# Patient Record
Sex: Female | Born: 1955 | Race: White | Hispanic: No | Marital: Married | Smoking: Never smoker
Health system: Southern US, Community
[De-identification: ages and names within clinical notes are randomized; demographics above are authoritative.]

## PROBLEM LIST (undated history)

## (undated) DIAGNOSIS — F419 Anxiety disorder, unspecified: Secondary | ICD-10-CM

## (undated) DIAGNOSIS — E119 Type 2 diabetes mellitus without complications: Secondary | ICD-10-CM

## (undated) DIAGNOSIS — E785 Hyperlipidemia, unspecified: Secondary | ICD-10-CM

## (undated) DIAGNOSIS — K2270 Barrett's esophagus without dysplasia: Secondary | ICD-10-CM

## (undated) DIAGNOSIS — R011 Cardiac murmur, unspecified: Secondary | ICD-10-CM

## (undated) DIAGNOSIS — T7840XA Allergy, unspecified, initial encounter: Secondary | ICD-10-CM

## (undated) DIAGNOSIS — I639 Cerebral infarction, unspecified: Secondary | ICD-10-CM

## (undated) DIAGNOSIS — Z780 Asymptomatic menopausal state: Secondary | ICD-10-CM

## (undated) DIAGNOSIS — G4700 Insomnia, unspecified: Secondary | ICD-10-CM

## (undated) DIAGNOSIS — I10 Essential (primary) hypertension: Secondary | ICD-10-CM

## (undated) DIAGNOSIS — R748 Abnormal levels of other serum enzymes: Secondary | ICD-10-CM

## (undated) HISTORY — DX: Essential (primary) hypertension: I10

## (undated) HISTORY — DX: Cardiac murmur, unspecified: R01.1

## (undated) HISTORY — DX: Anxiety disorder, unspecified: F41.9

## (undated) HISTORY — DX: Insomnia, unspecified: G47.00

## (undated) HISTORY — DX: Cerebral infarction, unspecified: I63.9

## (undated) HISTORY — DX: Hyperlipidemia, unspecified: E78.5

## (undated) HISTORY — PX: TONSILLECTOMY: SUR1361

## (undated) HISTORY — DX: Asymptomatic menopausal state: Z78.0

## (undated) HISTORY — DX: Barrett's esophagus without dysplasia: K22.70

## (undated) HISTORY — DX: Allergy, unspecified, initial encounter: T78.40XA

## (undated) HISTORY — DX: Type 2 diabetes mellitus without complications: E11.9

## (undated) HISTORY — DX: Abnormal levels of other serum enzymes: R74.8

---

## 1990-02-25 HISTORY — PX: CHOLECYSTECTOMY: SHX55

## 1999-02-26 LAB — HM COLONOSCOPY: HM Colonoscopy: NORMAL

## 2007-04-29 ENCOUNTER — Other Ambulatory Visit: Payer: Self-pay

## 2007-04-29 ENCOUNTER — Inpatient Hospital Stay: Payer: Self-pay | Admitting: Internal Medicine

## 2007-09-23 ENCOUNTER — Ambulatory Visit: Payer: Self-pay | Admitting: Internal Medicine

## 2007-09-30 ENCOUNTER — Ambulatory Visit: Payer: Self-pay | Admitting: Internal Medicine

## 2007-10-15 ENCOUNTER — Ambulatory Visit: Payer: Self-pay | Admitting: Internal Medicine

## 2008-08-12 ENCOUNTER — Emergency Department: Payer: Self-pay | Admitting: Emergency Medicine

## 2009-07-10 ENCOUNTER — Other Ambulatory Visit: Payer: Self-pay | Admitting: Internal Medicine

## 2009-08-09 ENCOUNTER — Ambulatory Visit: Payer: Self-pay | Admitting: Internal Medicine

## 2009-11-25 ENCOUNTER — Emergency Department: Payer: Self-pay | Admitting: Emergency Medicine

## 2009-11-29 ENCOUNTER — Encounter: Payer: Self-pay | Admitting: General Practice

## 2010-11-21 ENCOUNTER — Telehealth: Payer: Self-pay | Admitting: Internal Medicine

## 2010-11-21 NOTE — Telephone Encounter (Signed)
Patient needs a refill on Nexium per [facility] patient need to contact Dr. Darrick Huntsman here. Contact patient on her cell phone [phone].

## 2010-11-22 ENCOUNTER — Other Ambulatory Visit: Payer: Self-pay | Admitting: Internal Medicine

## 2010-11-23 ENCOUNTER — Other Ambulatory Visit: Payer: Self-pay | Admitting: Internal Medicine

## 2010-11-23 MED ORDER — ESOMEPRAZOLE MAGNESIUM 40 MG PO CPDR: 40.0000 mg | DELAYED_RELEASE_CAPSULE | Freq: Every day | ORAL | Status: DC

## 2010-11-23 NOTE — Telephone Encounter (Signed)
Left message for patient to return my call.

## 2010-11-26 MED ORDER — ESOMEPRAZOLE MAGNESIUM 40 MG PO CPDR: 40.0000 mg | DELAYED_RELEASE_CAPSULE | Freq: Every day | ORAL | Status: DC

## 2011-01-24 ENCOUNTER — Encounter: Payer: Self-pay | Admitting: Internal Medicine

## 2011-01-24 ENCOUNTER — Other Ambulatory Visit (HOSPITAL_COMMUNITY)
Admission: RE | Admit: 2011-01-24 | Discharge: 2011-01-24 | Disposition: A | Discharge status: Home / Self Care | Payer: PRIVATE HEALTH INSURANCE | Source: Ambulatory Visit | Attending: Internal Medicine | Admitting: Internal Medicine

## 2011-01-24 ENCOUNTER — Ambulatory Visit (INDEPENDENT_AMBULATORY_CARE_PROVIDER_SITE_OTHER): Payer: PRIVATE HEALTH INSURANCE | Admitting: Internal Medicine

## 2011-01-24 DIAGNOSIS — Z01419 Encounter for gynecological examination (general) (routine) without abnormal findings: Secondary | ICD-10-CM | POA: Insufficient documentation

## 2011-01-24 DIAGNOSIS — F411 Generalized anxiety disorder: Secondary | ICD-10-CM

## 2011-01-24 DIAGNOSIS — E669 Obesity, unspecified: Secondary | ICD-10-CM

## 2011-01-24 DIAGNOSIS — Z780 Asymptomatic menopausal state: Secondary | ICD-10-CM

## 2011-01-24 DIAGNOSIS — K760 Fatty (change of) liver, not elsewhere classified: Secondary | ICD-10-CM | POA: Insufficient documentation

## 2011-01-24 DIAGNOSIS — E119 Type 2 diabetes mellitus without complications: Secondary | ICD-10-CM

## 2011-01-24 DIAGNOSIS — R748 Abnormal levels of other serum enzymes: Secondary | ICD-10-CM

## 2011-01-24 DIAGNOSIS — F419 Anxiety disorder, unspecified: Secondary | ICD-10-CM

## 2011-01-24 DIAGNOSIS — Z1159 Encounter for screening for other viral diseases: Secondary | ICD-10-CM | POA: Insufficient documentation

## 2011-01-24 DIAGNOSIS — Z124 Encounter for screening for malignant neoplasm of cervix: Secondary | ICD-10-CM

## 2011-01-24 DIAGNOSIS — N951 Menopausal and female climacteric states: Secondary | ICD-10-CM

## 2011-01-24 DIAGNOSIS — K2270 Barrett's esophagus without dysplasia: Secondary | ICD-10-CM | POA: Insufficient documentation

## 2011-01-24 MED ORDER — ESOMEPRAZOLE MAGNESIUM 40 MG PO CPDR: 40.0000 mg | DELAYED_RELEASE_CAPSULE | Freq: Every day | ORAL | Status: DC

## 2011-01-24 MED ORDER — ESCITALOPRAM OXALATE 5 MG PO TABS: 5.0000 mg | ORAL_TABLET | Freq: Every day | ORAL | Status: AC

## 2011-01-24 NOTE — Progress Notes (Signed)
  Subjective:    Patient ID: Autumn Bird, female    DOB: 10-02-1955, 55 y.o.   MRN: [id number]  HPI   55 yo white female,  Presents for annual exam.  Recent employee labs indicate new onset dm with hgba1c of 6.7  She has also gained weight despite reporting attempts to diet and exercise.  Frequently skips meals due to work schedule. Cites stressors and work as obstacles to healthy eating habits.  Past Medical History  Diagnosis Date  . Barrett's esophagus     managed by Dr. Mechele Collin with Nexium  . Elevated liver enzymes     chronic, no workup done  . Menopause age 25   No current outpatient prescriptions on file prior to visit.    Review of Systems  Constitutional: Positive for unexpected weight change. Negative for fever and chills.  HENT: Negative for hearing loss, ear pain, nosebleeds, congestion, sore throat, facial swelling, rhinorrhea, sneezing, mouth sores, trouble swallowing, neck pain, neck stiffness, voice change, postnasal drip, sinus pressure, tinnitus and ear discharge.   Eyes: Negative for pain, discharge, redness and visual disturbance.  Respiratory: Negative for cough, chest tightness, shortness of breath, wheezing and stridor.   Cardiovascular: Negative for chest pain, palpitations and leg swelling.  Musculoskeletal: Negative for myalgias and arthralgias.  Skin: Negative for color change and rash.  Neurological: Negative for dizziness, weakness, light-headedness and headaches.  Hematological: Negative for adenopathy.       Objective:   Physical Exam  Constitutional: She is oriented to person, place, and time. She appears well-developed and well-nourished.  HENT:  Mouth/Throat: Oropharynx is clear and moist.  Eyes: EOM are normal. Pupils are equal, round, and reactive to light. No scleral icterus.  Neck: Normal range of motion. Neck supple. No JVD present. No thyromegaly present.  Cardiovascular: Normal rate, regular rhythm, normal heart sounds and intact distal  pulses.   Pulmonary/Chest: Effort normal and breath sounds normal.  Abdominal: Soft. Bowel sounds are normal. She exhibits no mass. There is no tenderness.  Genitourinary: Vagina normal and uterus normal.  Musculoskeletal: Normal range of motion. She exhibits no edema.  Lymphadenopathy:    She has no cervical adenopathy.  Neurological: She is alert and oriented to person, place, and time.  Skin: Skin is warm and dry.  Psychiatric: She has a normal mood and affect.          Assessment & Plan:

## 2011-01-24 NOTE — Patient Instructions (Addendum)
Consider  the Medifast diet to help you lose weight.  It is very, very effective and takes all of the calculations and guesswork for you.   Call Dr. Dahlia Client office to see if he is doing another seminar on the [employer].     Your goal with exercise should be 20 minutes of aerobic exercise 5 days per week  I recommend a trial of low dose Lexapro (escitalopram), to help your mood and your hot flashes:  5 mg daily.

## 2011-01-25 ENCOUNTER — Ambulatory Visit: Payer: Self-pay | Admitting: Internal Medicine

## 2011-01-26 ENCOUNTER — Encounter: Payer: Self-pay | Admitting: Internal Medicine

## 2011-01-26 DIAGNOSIS — E669 Obesity, unspecified: Secondary | ICD-10-CM | POA: Insufficient documentation

## 2011-01-26 DIAGNOSIS — Z780 Asymptomatic menopausal state: Secondary | ICD-10-CM | POA: Insufficient documentation

## 2011-01-26 DIAGNOSIS — E1169 Type 2 diabetes mellitus with other specified complication: Secondary | ICD-10-CM | POA: Insufficient documentation

## 2011-01-26 NOTE — Assessment & Plan Note (Signed)
New diagnosisl suggested by hgba1c of 6.7 Diet and exercise counseling given with reommendaations to consider the Medifast diet.

## 2011-01-26 NOTE — Assessment & Plan Note (Signed)
She reports weight gain, hot flashes, depression, and fatigue.  Discussed therapy choices but she did not want to resume menses or gain more weight so no treatment accepted .

## 2011-01-26 NOTE — Assessment & Plan Note (Signed)
Discussed the probability that given her central obesity, new onset diabetes, hypertriglyceridemia, her elevated liver enzymes may be due to fatty liver, which can lead to cirrhosis.  She is not interested in ruling out this diagnosis because her previous PCP Dr. Randa Lynn thought the the etiology was her PPI use, which cannot be suspended because she has a history of Barrett's esophagus. Recommended abdominal ultrasound and G referral but she is deferring for now.

## 2011-01-26 NOTE — Assessment & Plan Note (Addendum)
By prior EGD.  Continue PPI and followup with GI

## 2011-01-30 ENCOUNTER — Encounter: Payer: Self-pay | Admitting: Internal Medicine

## 2011-02-13 ENCOUNTER — Encounter: Payer: Self-pay | Admitting: Internal Medicine

## 2012-02-14 ENCOUNTER — Other Ambulatory Visit: Payer: Self-pay | Admitting: Internal Medicine

## 2012-02-14 LAB — COMPREHENSIVE METABOLIC PANEL
Alkaline Phosphatase: 105 U/L (ref 50–136)
Bilirubin,Total: 0.7 mg/dL (ref 0.2–1.0)
Calcium, Total: 8.8 mg/dL (ref 8.5–10.1)
Chloride: 105 mmol/L (ref 98–107)
Co2: 28 mmol/L (ref 21–32)
EGFR (African American): >60
EGFR (Non-African Amer.): >60
Potassium: 3.8 mmol/L (ref 3.5–5.1)
SGOT(AST): 196 U/L — ABNORMAL HIGH (ref 15–37)
SGPT (ALT): 191 U/L — ABNORMAL HIGH (ref 12–78)

## 2012-02-14 LAB — CBC WITH DIFFERENTIAL/PLATELET
Basophil %: 1.1 %
HCT: 42.2 % (ref 35.0–47.0)
HGB: 14.8 g/dL (ref 12.0–16.0)
Lymphocyte %: 49.1 %
Monocyte %: 7.5 %
Neutrophil #: 3.1 10*3/uL (ref 1.4–6.5)
Neutrophil %: 39.5 %
RDW: 13 % (ref 11.5–14.5)
WBC: 7.8 10*3/uL (ref 3.6–11.0)

## 2012-02-14 LAB — BILIRUBIN, DIRECT: Bilirubin, Direct: 0.3 mg/dL — ABNORMAL HIGH (ref 0.00–0.20)

## 2012-02-14 LAB — LIPID PANEL
Cholesterol: 209 mg/dL — ABNORMAL HIGH (ref 0–200)
HDL Cholesterol: 37 mg/dL — ABNORMAL LOW (ref 40–60)
Ldl Cholesterol, Calc: 142 mg/dL — ABNORMAL HIGH (ref 0–100)
Triglycerides: 151 mg/dL (ref 0–200)

## 2012-05-08 ENCOUNTER — Ambulatory Visit: Payer: Self-pay | Admitting: General Practice

## 2012-05-26 ENCOUNTER — Ambulatory Visit: Payer: Self-pay | Admitting: General Practice

## 2012-06-25 ENCOUNTER — Ambulatory Visit: Payer: Self-pay | Admitting: General Practice

## 2013-09-19 ENCOUNTER — Emergency Department: Payer: Self-pay | Admitting: Emergency Medicine

## 2014-03-22 ENCOUNTER — Ambulatory Visit: Payer: Self-pay | Admitting: Internal Medicine

## 2014-03-22 LAB — HM MAMMOGRAPHY: HM MAMMO: NEGATIVE

## 2014-06-01 ENCOUNTER — Encounter: Payer: Self-pay | Admitting: *Deleted

## 2014-06-08 ENCOUNTER — Ambulatory Visit: Payer: Self-pay

## 2014-07-05 ENCOUNTER — Other Ambulatory Visit: Payer: Self-pay

## 2014-07-05 NOTE — Patient Instructions (Signed)
Basic Carbohydrate Counting for Diabetes Mellitus Carbohydrate counting is a method for keeping track of the amount of carbohydrates you eat. Eating carbohydrates naturally increases the level of sugar (glucose) in your blood, so it is important for you to know the amount that is okay for you to have in every meal. Carbohydrate counting helps keep the level of glucose in your blood within normal limits. The amount of carbohydrates allowed is different for every person. A dietitian can help you calculate the amount that is right for you. Once you know the amount of carbohydrates you can have, you can count the carbohydrates in the foods you want to eat. Carbohydrates are found in the following foods:  Grains, such as breads and cereals.  Dried beans and soy products.  Starchy vegetables, such as potatoes, peas, and corn.  Fruit and fruit juices.  Milk and yogurt.  Sweets and snack foods, such as cake, cookies, candy, chips, soft drinks, and fruit drinks. CARBOHYDRATE COUNTING There are two ways to count the carbohydrates in your food. You can use either of the methods or a combination of both. Reading the "Nutrition Facts" on Packaged Food The "Nutrition Facts" is an area that is included on the labels of almost all packaged food and beverages in the [place]. It includes the serving size of that food or beverage and information about the nutrients in each serving of the food, including the grams (g) of carbohydrate per serving.  Decide the number of servings of this food or beverage that you will be able to eat or drink. Multiply that number of servings by the number of grams of carbohydrate that is listed on the label for that serving. The total will be the amount of carbohydrates you will be having when you eat or drink this food or beverage. Learning Standard Serving Sizes of Food When you eat food that is not packaged or does not include "Nutrition Facts" on the label, you need to  measure the servings in order to count the amount of carbohydrates.A serving of most carbohydrate-rich foods contains about 15 g of carbohydrates. The following list includes serving sizes of carbohydrate-rich foods that provide 15 g ofcarbohydrate per serving:   1 slice of bread (1 oz) or 1 six-inch tortilla.    of a hamburger bun or English muffin.  4-6 crackers.   cup unsweetened dry cereal.    cup hot cereal.   cup rice or pasta.    cup mashed potatoes or  of a large baked potato.  1 cup fresh fruit or one small piece of fruit.    cup canned or frozen fruit or fruit juice.  1 cup milk.   cup plain fat-free yogurt or yogurt sweetened with artificial sweeteners.   cup cooked dried beans or starchy vegetable, such as peas, corn, or potatoes.  Decide the number of standard-size servings that you will eat. Multiply that number of servings by 15 (the grams of carbohydrates in that serving). For example, if you eat 2 cups of strawberries, you will have eaten 2 servings and 30 g of carbohydrates (2 servings x 15 g = 30 g). For foods such as soups and casseroles, in which more than one food is mixed in, you will need to count the carbohydrates in each food that is included. EXAMPLE OF CARBOHYDRATE COUNTING Sample Dinner  3 oz chicken breast.   cup of brown rice.   cup of corn.  1 cup milk.   1 cup strawberries with   sugar-free whipped topping.  Carbohydrate Calculation Step 1: Identify the foods that contain carbohydrates:   Rice.   Corn.   Milk.   Strawberries. Step 2:Calculate the number of servings eaten of each:   2 servings of rice.   1 serving of corn.   1 serving of milk.   1 serving of strawberries. Step 3: Multiply each of those number of servings by 15 g:   2 servings of rice x 15 g = 30 g.   1 serving of corn x 15 g = 15 g.   1 serving of milk x 15 g = 15 g.   1 serving of strawberries x 15 g = 15 g. Step 4: Add  together all of the amounts to find the total grams of carbohydrates eaten: 30 g + 15 g + 15 g + 15 g = 75 g. Document Released: 02/11/2005 Document Revised: 06/28/2013 Document Reviewed: 01/08/2013 ExitCare Patient Information 2015 [employer]. This information is not intended to replace advice given to you by your health care provider. Make sure you discuss any questions you have with your health care provider. Diabetes Mellitus and Food It is important for you to manage your blood sugar (glucose) level. Your blood glucose level can be greatly affected by what you eat. Eating healthier foods in the appropriate amounts throughout the day at about the same time each day will help you control your blood glucose level. It can also help slow or prevent worsening of your diabetes mellitus. Healthy eating may even help you improve the level of your blood pressure and reach or maintain a healthy weight.  HOW CAN FOOD AFFECT ME? Carbohydrates Carbohydrates affect your blood glucose level more than any other type of food. Your dietitian will help you determine how many carbohydrates to eat at each meal and teach you how to count carbohydrates. Counting carbohydrates is important to keep your blood glucose at a healthy level, especially if you are using insulin or taking certain medicines for diabetes mellitus. Alcohol Alcohol can cause sudden decreases in blood glucose (hypoglycemia), especially if you use insulin or take certain medicines for diabetes mellitus. Hypoglycemia can be a life-threatening condition. Symptoms of hypoglycemia (sleepiness, dizziness, and disorientation) are similar to symptoms of having too much alcohol.  If your health care provider has given you approval to drink alcohol, do so in moderation and use the following guidelines:  Women should not have more than one drink per day, and men should not have more than two drinks per day. One drink is equal to:  12 oz of beer.  5 oz of  wine.  1 oz of hard liquor.  Do not drink on an empty stomach.  Keep yourself hydrated. Have water, diet soda, or unsweetened iced tea.  Regular soda, juice, and other mixers might contain a lot of carbohydrates and should be counted. WHAT FOODS ARE NOT RECOMMENDED? As you make food choices, it is important to remember that all foods are not the same. Some foods have fewer nutrients per serving than other foods, even though they might have the same number of calories or carbohydrates. It is difficult to get your body what it needs when you eat foods with fewer nutrients. Examples of foods that you should avoid that are high in calories and carbohydrates but low in nutrients include:  Trans fats (most processed foods list trans fats on the Nutrition Facts label).  Regular soda.  Juice.  Candy.  Sweets, such as cake, pie,   doughnuts, and cookies.  Fried foods. WHAT FOODS CAN I EAT? Have nutrient-rich foods, which will nourish your body and keep you healthy. The food you should eat also will depend on several factors, including:  The calories you need.  The medicines you take.  Your weight.  Your blood glucose level.  Your blood pressure level.  Your cholesterol level. You also should eat a variety of foods, including:  Protein, such as meat, poultry, fish, tofu, nuts, and seeds (lean animal proteins are best).  Fruits.  Vegetables.  Dairy products, such as milk, cheese, and yogurt (low fat is best).  Breads, grains, pasta, cereal, rice, and beans.  Fats such as olive oil, trans fat-free margarine, canola oil, avocado, and olives. DOES EVERYONE WITH DIABETES MELLITUS HAVE THE SAME MEAL PLAN? Because every person with diabetes mellitus is different, there is not one meal plan that works for everyone. It is very important that you meet with a dietitian who will help you create a meal plan that is just right for you. Document Released: 11/08/2004 Document Revised:  02/16/2013 Document Reviewed: 01/08/2013 ExitCare Patient Information 2015 [employer]. This information is not intended to replace advice given to you by your health care provider. Make sure you discuss any questions you have with your health care provider.  

## 2014-07-05 NOTE — Addendum Note (Signed)
Addended by: Pollyann Glen on: 07/05/2014 03:45 PM   Modules accepted: Orders, Medications

## 2014-07-05 NOTE — Patient Outreach (Addendum)
[place] [facility]) Care Management  [place]  07/05/2014   SHARREN SCHNURR [birth year] [id number]  Subjective: Patient comes to me with a complaint of weight gain and elevated am blood sugars.  She complains that she's been over eating at work because the staff is getting donuts and pizza for the last few weeks- she loves pasta and sweets and has difficulty limiting these foods at work. Patient states she's exercising 2x/week.   Objective: Blood sugars within ADA guidelines- weight up approx. 4 lbs since I saw her in October, 2015.   Current Medications:  Current Outpatient Prescriptions  Medication Sig Dispense Refill  . b complex vitamins tablet Take 1 tablet by mouth daily.      Marland Kitchen escitalopram (LEXAPRO) 5 MG tablet TAKE 1 TABLET BY MOUTH TWICE DAILY    . glucose blood test strip USE 1 STRIP TWICE A DAY AS DIRECTED    . metFORMIN (GLUCOPHAGE-XR) 500 MG 24 hr tablet TAKE 1 TABLET BY MOUTH TWICE A DAY    . omeprazole (PRILOSEC) 40 MG capsule Take 40 mg by mouth daily.     [name] DELICA LANCETS 44W MISC USE 1 LANCET TO SKIN TWICE A DAY AS DIRECTED    . vitamin B-12 (CYANOCOBALAMIN) 100 MCG tablet Take by mouth daily.      No current facility-administered medications for this visit.    Functional Status:  In your present state of health, do you have any difficulty performing the following activities: 07/05/2014  Hearing? N  Vision? N  Difficulty concentrating or making decisions? N  Walking or climbing stairs? N  Dressing or bathing? N  Doing errands, shopping? N    Fall/Depression Screening: PHQ 2/9 Scores 07/05/2014  PHQ - 2 Score 0    [facility] plate method of eating handout given to the patient- reviewed carbs and the need to limit carbs to manage weight and blood sugar. Begin by limiting carbs to 4/meal (3 ideal) control.  Discussed the need to increase exercise but not willing to set this as a goal at this time.   [facility] CM Care Plan Problem One    Patient Outreach from 07/05/2014 in [place] Problem One  Patient states she's overweight and blood sugars have been elevated in the am recently, especially when she's at work   Care Plan for Problem One  Active   [facility]N Long Term Goal (31-90 days)  Patient will decrease weight to 175 lbs within 6 months   THN Long Term Goal Start Date  07/05/14   Interventions for Problem One Long Term Goal  - fill 1/2 your plate with "free" vegetables for lunch and supper   - limit carbs to 4 servings per meal, 1 carb for a snack   - try wine with supper     - limit food at work to one "extra" only    - follow the "plate method" of eating     Plan: Follow care plan.  Patient ideally should eat 3 meals and 2 snacks (if needed); skipping breakfast can lead to overeating at the next meal.  Follow up with MD in December 2016.  To call me at any time to additional education/supper.   Gentry Fitz, RN, BA, [place], [place] Direct Dial:  [phone]  Fax:  [phone] E-mail: [email or web address] [address] [place], [place]  [address]

## 2014-09-19 ENCOUNTER — Telehealth: Payer: Self-pay

## 2014-10-25 ENCOUNTER — Telehealth: Payer: Self-pay

## 2014-12-02 ENCOUNTER — Telehealth: Payer: Self-pay

## 2014-12-12 ENCOUNTER — Other Ambulatory Visit: Payer: Self-pay

## 2014-12-12 NOTE — Patient Outreach (Signed)
[place] [facility]) Care Management  12/12/2014  Autumn Bird [birth year] [id number]   Spoke to patient by phone to discuss transfer of care to the pharmacy team.  Encouraged the patient to call me if she has questions about diabetes but confirmed her care will be ongoing with the pharmacy team.    Gentry Fitz, RN, [place], [place], [place]:  [phone]  Fax:  [phone] E-mail: [email or web address] [address] [place], [place]  [address]

## 2014-12-28 ENCOUNTER — Other Ambulatory Visit: Payer: Self-pay | Admitting: Pharmacist

## 2014-12-30 ENCOUNTER — Encounter: Payer: Self-pay | Admitting: Pharmacist

## 2014-12-30 NOTE — Patient Outreach (Signed)
[place] [facility]) Care Management  [employer]   12/30/2014  Autumn Bird 04-16-1955 [id number]  Subjective: Autumn Bird is 59 year old female who presents for her [facility] Diabetes visit with the pharmacist. She has not been eating appropriately at work; she admits to skipping meals and "grabbing whatever is available" when her blood sugar drops. She continues to be concerned about her weight and would like to maintain her current weight over the next few months during the holidays. She has stopped taking the B complex and B-12 vitamins due to experiencing low blood sugar when taking these vitamins. She was prescribed a statin for her cholesterol, but admits to not taking this medication. Her blood pressure was elevated at this visit, however, patient states it is usually within goal. She is not interested in taking an ACE inhibitor or an ARB for management of her blood pressure at this time. Patient was not sure if she should be taking a daily aspirin. She received her influenza vaccination 12/17/14.   Objective:  Fasting blood sugar reading was 120 mg/dl.  A1c was 6.3% on 02/24/14. Blood pressure reading was 148/72 mmHg at the visit.  Current Medications: Current Outpatient Prescriptions  Medication Sig Dispense Refill  . escitalopram (LEXAPRO) 5 MG tablet TAKE 1 TABLET BY MOUTH TWICE DAILY    . glucose blood test strip USE 1 STRIP TWICE A DAY AS DIRECTED    . metFORMIN (GLUCOPHAGE-XR) 500 MG 24 hr tablet TAKE 1 TABLET BY MOUTH TWICE A DAY    . omeprazole (PRILOSEC) 40 MG capsule Take 40 mg by mouth 2 (two) times daily.     Autumn Bird DELICA LANCETS 89Q MISC USE 1 LANCET TO SKIN TWICE A DAY AS DIRECTED    . b complex vitamins tablet Take 1 tablet by mouth daily.      . vitamin B-12 (CYANOCOBALAMIN) 100 MCG tablet Take by mouth daily.      No current facility-administered medications for this visit.    Functional Status: In your present state of health, do you  have any difficulty performing the following activities: 12/30/2014 07/05/2014  Hearing? N N  Vision? N N  Difficulty concentrating or making decisions? N N  Walking or climbing stairs? N N  Dressing or bathing? N N  Doing errands, shopping? N N    Fall/Depression Screening: PHQ 2/9 Scores 07/05/2014  PHQ - 2 Score 0   [facility] CM Care Plan Problem One        Most Recent Value   Care Plan Problem One  Patient states she's overweight    Role Documenting the Problem One  Clinical Pharmacist   Care Plan for Problem One  Active   THN Long Term Goal (31-90 days)  Patient will maintain weight to 175 lbs within 6 months,  evidenced by patient report at next visit   [place] Start Date  12/28/14   Interventions for Problem One Long Term Goal  - fill 1/2 your plate with "free" vegetables for lunch and supper   - limit carbs to 4 servings per meal, 1 carb for a snack   - try wine with supper     - limit food at work to one "extra" only    - follow the "plate method" of eating      Assessment: 1. Diabetes: A1c at goal of less than 7% in December 2015. Patient is interested in obtaining an A1c at the [facility] before her  physical in January. Last A1c was in December of 2015, although at goal, patient would like to maintain A1c less than 6% as in the past. Not currently taking an aspirin or statin. Patient is over 89, diabetic, hypercholesterolemia and possibly carriers a diagnosis of hypertension in the past. 2. Blood Pressure: above goal of 140/90 mmHg at today's visit. Patient states blood pressure is usually within goal. Not currently on any antihypertensive medications. Patient is not interested in taking an ACE inhibitor or an ARB as part of her diabetes regimen. 3. Depression assessment completed 06/2014; next assessment due in May 2017. 4. Nutrition assessment completed today; next assessment due January 2017.  5. Quality of life assessment completed today; next assessment due March  2017.   Plan: 1. Diabetes: I will contact the nurse at the [facility] to schedule the A1c for Autumn Bird. Patient to follow prescribed meal plan (plate method). I will resend the two EMMI education programs on diabetes and carbohydrates for her to review. Will recommend restarting exercise regimen at next pharmacist visit once patient is finished with new house. 2. Blood Pressure: will follow up with patient after her physical in January to see if blood pressure is within goal. May need to consider adding an ACE inhibitor or ARB if no contraindications. 3. Scheduled to see her primary care physician for a physical 02/2015. Patient was encouraged to discuss the addition of aspirin to her regimen. 4. Patient will follow up with the pharmacist in 6 months.    Abdias Hickam K. Dicky Doe, PharmD [place] [facility] [phone]

## 2015-01-11 ENCOUNTER — Other Ambulatory Visit: Payer: Self-pay

## 2015-01-11 NOTE — Patient Outreach (Signed)
[name] Care Management  01/11/2015  Autumn Bird 07-Jan-1956 [id number]  I received a message from [name] that [name] would like a POCT A1C.  I have called [name], left a message and asked her to call me to schedule an appointment.    Gentry Fitz, RN, BA, [place], [place] Direct Dial:  [phone]  Fax:  [phone] E-mail: [email or web address] [address], [place], [place]  [address]

## 2015-03-17 ENCOUNTER — Encounter: Payer: Self-pay | Admitting: Pharmacist

## 2015-06-28 ENCOUNTER — Ambulatory Visit: Payer: Self-pay | Admitting: Pharmacist

## 2016-01-08 ENCOUNTER — Encounter: Payer: Self-pay | Admitting: Physician Assistant

## 2016-01-08 ENCOUNTER — Ambulatory Visit: Payer: Self-pay | Admitting: Physician Assistant

## 2016-01-08 VITALS — BP 130/70 | HR 91 | Temp 98.7°F

## 2016-01-08 DIAGNOSIS — J0100 Acute maxillary sinusitis, unspecified: Secondary | ICD-10-CM

## 2016-01-08 MED ORDER — AZITHROMYCIN 250 MG PO TABS: ORAL_TABLET | ORAL | 0 refills | Status: DC

## 2016-01-08 MED ORDER — FLUTICASONE PROPIONATE 50 MCG/ACT NA SUSP: 2.0000 | Freq: Every day | NASAL | 6 refills | Status: DC

## 2016-01-08 NOTE — Progress Notes (Signed)
S: C/o runny nose and congestion for 3 days, no fever, chills, cp/sob, v/d; mucus is green and thick, cough is sporadic, c/o of facial and dental pain. Had same sx 2 weeks ago and got better, now sx returned and are worse  Using otc meds:   O: PE: vitals wnl, nad, perrl eomi, normocephalic, tms dull, nasal mucosa red and swollen, throat injected, neck supple no lymph, lungs c t a, cv rrr, neuro intact  A:  Acute sinusitis   P: drink fluids, continue regular meds , use otc meds of choice, return if not improving in 5 days, return earlier if worsening , zpack, flonase ]

## 2017-04-07 ENCOUNTER — Other Ambulatory Visit: Payer: Self-pay | Admitting: Internal Medicine

## 2017-04-07 DIAGNOSIS — R945 Abnormal results of liver function studies: Secondary | ICD-10-CM

## 2017-04-15 ENCOUNTER — Ambulatory Visit
Admission: RE | Admit: 2017-04-15 | Discharge: 2017-04-15 | Disposition: A | Discharge status: Home / Self Care | Payer: BLUE CROSS/BLUE SHIELD | Source: Ambulatory Visit | Attending: Internal Medicine | Admitting: Internal Medicine

## 2017-04-15 DIAGNOSIS — K760 Fatty (change of) liver, not elsewhere classified: Secondary | ICD-10-CM | POA: Insufficient documentation

## 2017-04-15 DIAGNOSIS — R945 Abnormal results of liver function studies: Secondary | ICD-10-CM | POA: Diagnosis not present

## 2019-06-23 ENCOUNTER — Ambulatory Visit: Payer: BC Managed Care – PPO | Admitting: Dermatology

## 2019-06-23 ENCOUNTER — Other Ambulatory Visit: Payer: Self-pay

## 2019-06-23 DIAGNOSIS — L821 Other seborrheic keratosis: Secondary | ICD-10-CM | POA: Diagnosis not present

## 2019-06-23 DIAGNOSIS — L719 Rosacea, unspecified: Secondary | ICD-10-CM | POA: Diagnosis not present

## 2019-06-23 DIAGNOSIS — L570 Actinic keratosis: Secondary | ICD-10-CM | POA: Diagnosis not present

## 2019-06-23 DIAGNOSIS — L578 Other skin changes due to chronic exposure to nonionizing radiation: Secondary | ICD-10-CM

## 2019-06-23 NOTE — Progress Notes (Signed)
   Follow-Up Visit   Subjective  Autumn Bird is a 64 y.o. female who presents for the following: Other (Spots of face get irritated. They have been there for over a year an d she picks at them.).   `  The following portions of the chart were reviewed this encounter and updated as appropriate:  Tobacco  Allergies  Meds  Problems  Med Hx  Surg Hx  Fam Hx      Review of Systems:  No other skin or systemic complaints except as noted in HPI or Assessment and Plan.  Objective  Well appearing patient in no apparent distress; mood and affect are within normal limits.  A focused examination was performed including face. Relevant physical exam findings are noted in the Assessment and Plan.  Objective  Left Upper Vermilion Lip, Right Medial Cheek (3), Right nose: Erythematous thin papules/macules with gritty scale.   Objective  Head - Anterior (Face): Mid face erythema with telangiectasias.   Assessment & Plan    Actinic Damage - diffuse scaly erythematous macules with underlying dyspigmentation - Recommend daily broad spectrum sunscreen SPF 30+ to sun-exposed areas, reapply every 2 hours as needed.  - Call for new or changing lesions.  Seborrheic Keratoses - Stuck-on, waxy, tan-brown papules and plaques  - Discussed benign etiology and prognosis. - Observe - Call for any changes  AK (actinic keratosis) (5) Right nose; Left Upper Vermilion Lip; Right Medial Cheek (3)  Destruction of lesion - Left Upper Vermilion Lip, Right Medial Cheek, Right nose Complexity: simple   Destruction method: cryotherapy   Informed consent: discussed and consent obtained   Timeout:  patient name, date of birth, surgical site, and procedure verified Lesion destroyed using liquid nitrogen: Yes   Region frozen until ice ball extended beyond lesion: Yes   Outcome: patient tolerated procedure well with no complications   Post-procedure details: wound care instructions given    Rosacea Head -  Anterior (Face)  Mild - no treatment  Return in about 3 months (around 09/22/2019).  I, Ashok Cordia, CMA, am acting as scribe for Sarina Ser, MD .    Documentation: I have reviewed the above documentation for accuracy and completeness, and I agree with the above.  Sarina Ser, MD

## 2019-06-23 NOTE — Patient Instructions (Signed)

## 2019-06-26 ENCOUNTER — Encounter: Payer: Self-pay | Admitting: Dermatology

## 2019-10-04 ENCOUNTER — Ambulatory Visit: Payer: BC Managed Care – PPO | Admitting: Dermatology

## 2019-10-04 ENCOUNTER — Other Ambulatory Visit: Payer: Self-pay

## 2019-10-04 DIAGNOSIS — L570 Actinic keratosis: Secondary | ICD-10-CM | POA: Diagnosis not present

## 2019-10-04 NOTE — Patient Instructions (Signed)

## 2019-10-04 NOTE — Progress Notes (Signed)
   Follow-Up Visit   Subjective  Autumn Bird is a 64 y.o. female who presents for the following: Follow-up (AK follow up of right nose, left upper vermillion lip, right med cheek treated with LN2 x 5).  The following portions of the chart were reviewed this encounter and updated as appropriate:  Tobacco  Allergies  Meds  Problems  Med Hx  Surg Hx  Fam Hx     Review of Systems:  No other skin or systemic complaints except as noted in HPI or Assessment and Plan.  Objective  Well appearing patient in no apparent distress; mood and affect are within normal limits.  A focused examination was performed including face. Relevant physical exam findings are noted in the Assessment and Plan.  Objective  Right nose x 2, right infraorbital x 1 (3): Erythematous thin papules/macules with gritty scale.    Assessment & Plan    Actinic Damage - diffuse scaly erythematous macules with underlying dyspigmentation - Recommend daily broad spectrum sunscreen SPF 30+ to sun-exposed areas, reapply every 2 hours as needed.  - Call for new or changing lesions.   AK (actinic keratosis) (3) Right nose x 2, right infraorbital x 1  Destruction of lesion - Right nose x 2, right infraorbital x 1 Complexity: simple   Destruction method: cryotherapy   Informed consent: discussed and consent obtained   Timeout:  patient name, date of birth, surgical site, and procedure verified Lesion destroyed using liquid nitrogen: Yes   Region frozen until ice ball extended beyond lesion: Yes   Outcome: patient tolerated procedure well with no complications   Post-procedure details: wound care instructions given    Return in about 1 year (around 10/03/2020).   I, Ashok Cordia, CMA, am acting as scribe for Sarina Ser, MD .  Documentation: I have reviewed the above documentation for accuracy and completeness, and I agree with the above.  Sarina Ser, MD

## 2019-10-05 ENCOUNTER — Encounter: Payer: Self-pay | Admitting: Dermatology

## 2019-10-18 ENCOUNTER — Ambulatory Visit: Payer: BC Managed Care – PPO | Attending: Internal Medicine

## 2019-10-18 DIAGNOSIS — Z23 Encounter for immunization: Secondary | ICD-10-CM

## 2019-10-18 NOTE — Progress Notes (Signed)
   Covid-19 Vaccination Clinic  Name:  Autumn Bird    MRN: [id number] DOB: [birth year]  10/18/2019  [name] was observed post Covid-19 immunization for 15 minutes without incident. She was provided with Vaccine Information Sheet and instruction to access the V-Safe system.   [name] was instructed to call 911 with any severe reactions post vaccine: Marland Kitchen Difficulty breathing  . Swelling of face and throat  . A fast heartbeat  . A bad rash all over body  . Dizziness and weakness   Immunizations Administered    Name Date Dose VIS Date Route   Moderna COVID-19 Vaccine 10/18/2019  3:43 PM 0.5 mL 01/2019 Intramuscular   Manufacturer: Levan Hurst   Lot: [id number]   [place]: [id number]

## 2019-11-15 ENCOUNTER — Ambulatory Visit: Payer: BC Managed Care – PPO | Attending: Internal Medicine

## 2019-11-15 ENCOUNTER — Ambulatory Visit: Payer: Self-pay

## 2019-11-15 DIAGNOSIS — Z23 Encounter for immunization: Secondary | ICD-10-CM

## 2019-11-15 NOTE — Progress Notes (Signed)
   Covid-19 Vaccination Clinic  Name:  Autumn Bird    MRN: [id number] DOB: 1955/03/25  11/15/2019  [name] was observed post Covid-19 immunization for 15 minutes without incident. She was provided with Vaccine Information Sheet and instruction to access the V-Safe system.   [name] was instructed to call 911 with any severe reactions post vaccine: Marland Kitchen Difficulty breathing  . Swelling of face and throat  . A fast heartbeat  . A bad rash all over body  . Dizziness and weakness   Immunizations Administered    Name Date Dose VIS Date Route   Moderna COVID-19 Vaccine 11/15/2019  3:04 PM 0.5 mL 01/2019 Intramuscular   Manufacturer: Levan Hurst   Lot: [id number]   [place]: [id number]

## 2020-01-01 IMAGING — US US ABDOMEN LIMITED
1 series · 14 of 25 positions shown · non-contrast
Comparison: None available.

CLINICAL DATA: Abnormal liver function tests.

EXAM:
ULTRASOUND ABDOMEN LIMITED RIGHT UPPER QUADRANT

[Series 1: us abdomen limited · 0.25mm/px · 14 of 30 slices shown]
[im 1/30]
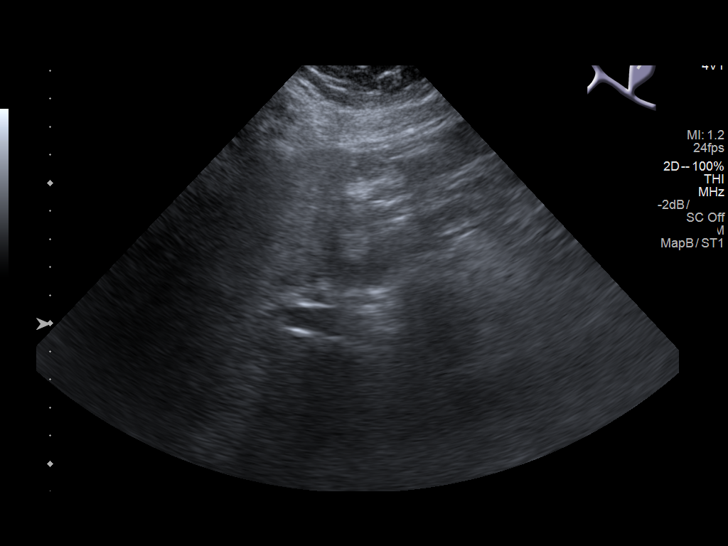
[im 3/30]
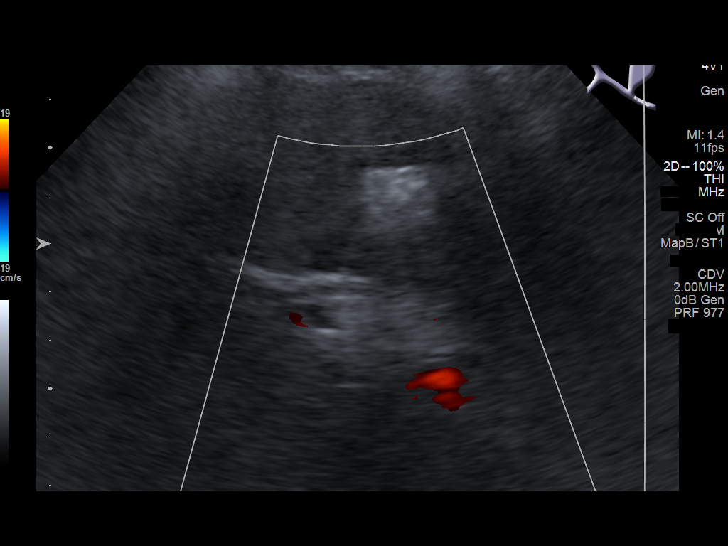
[im 5/30]
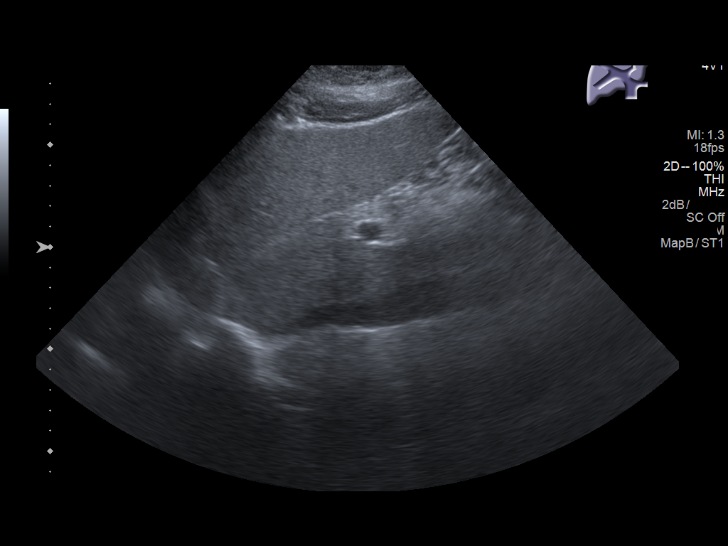
[im 8/30]
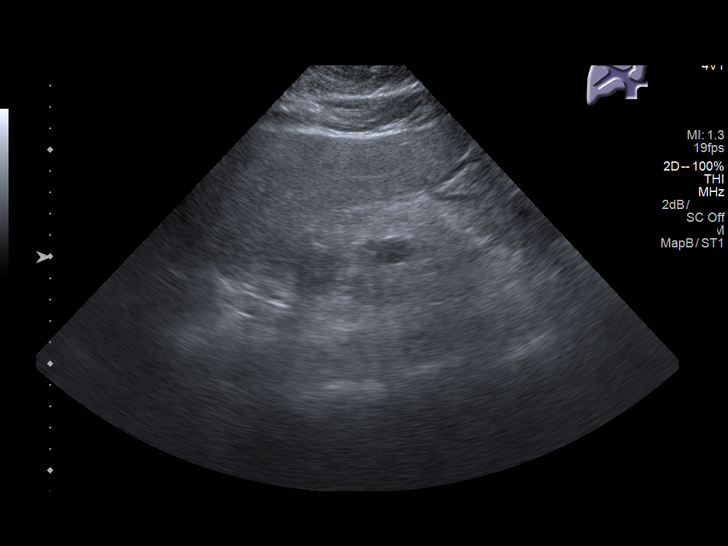
[im 10/30]
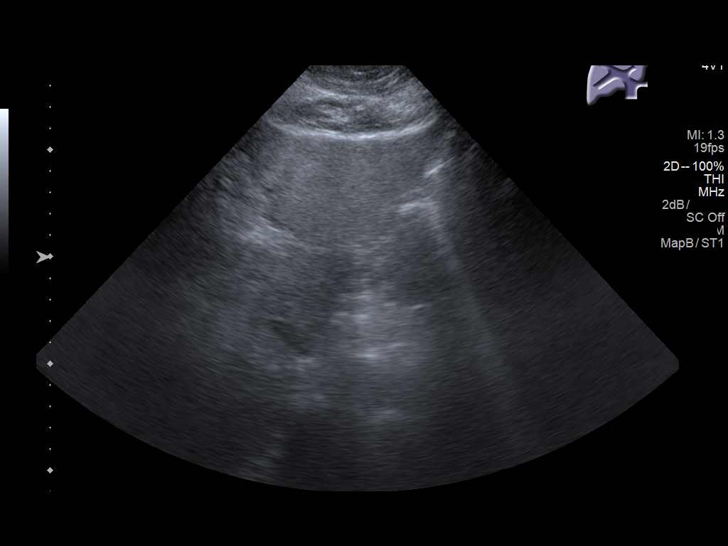
[im 11/30]
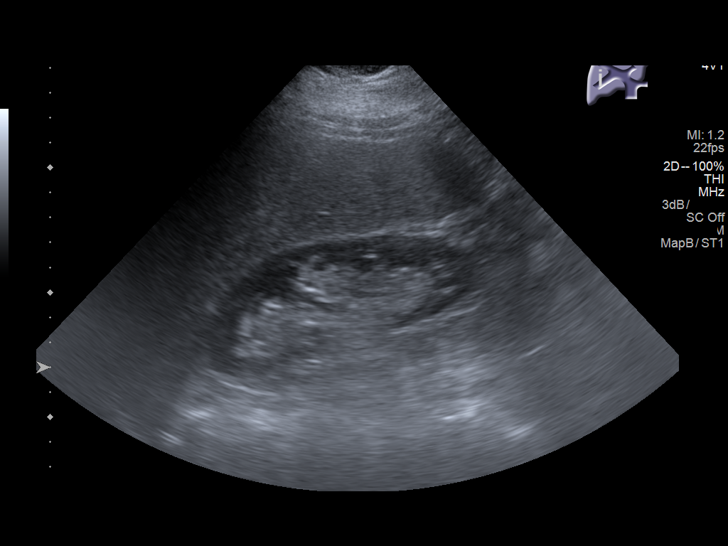
[im 14/30]
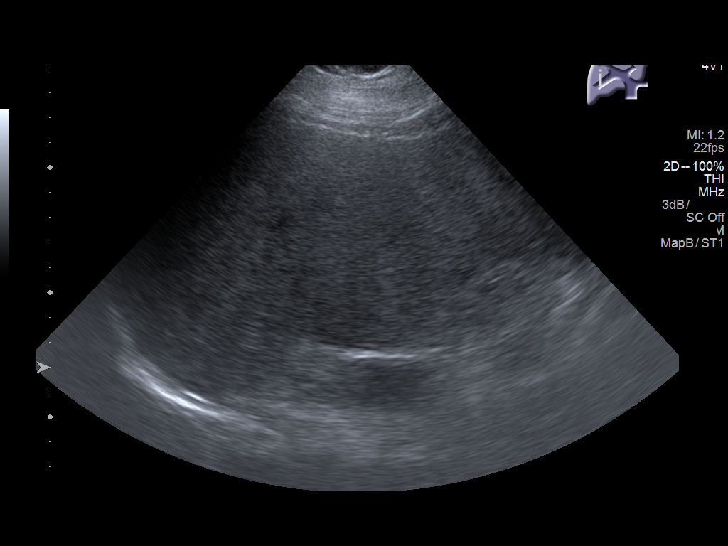
[im 16/30]
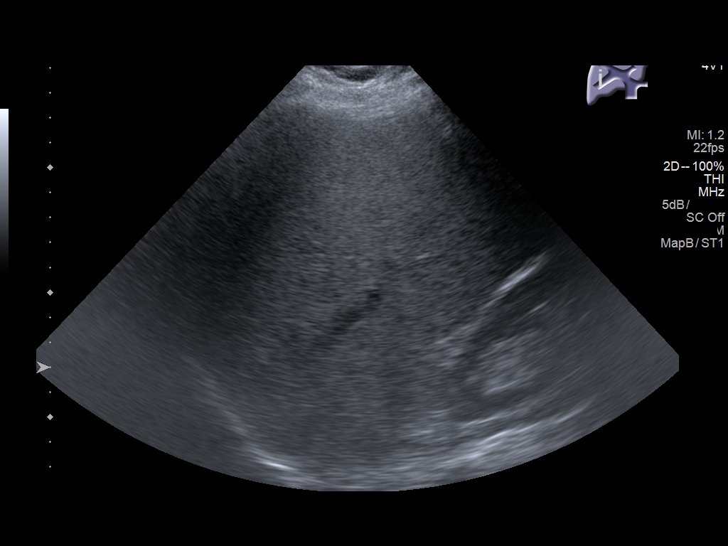
[im 19/30]
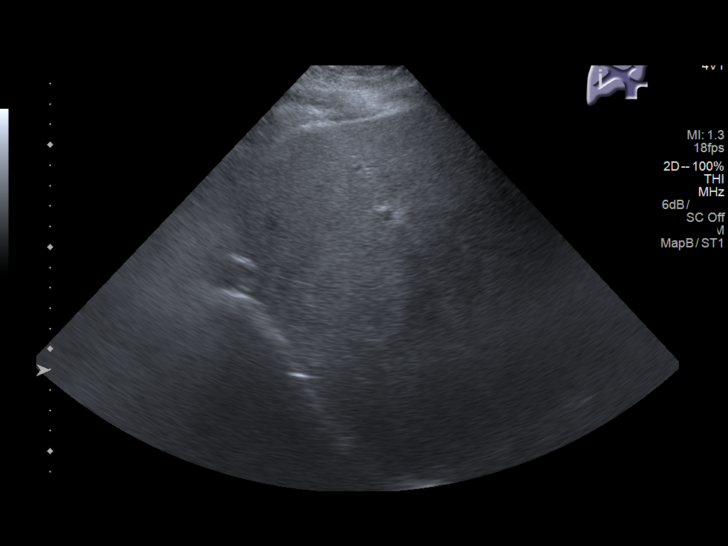
[im 20/30]
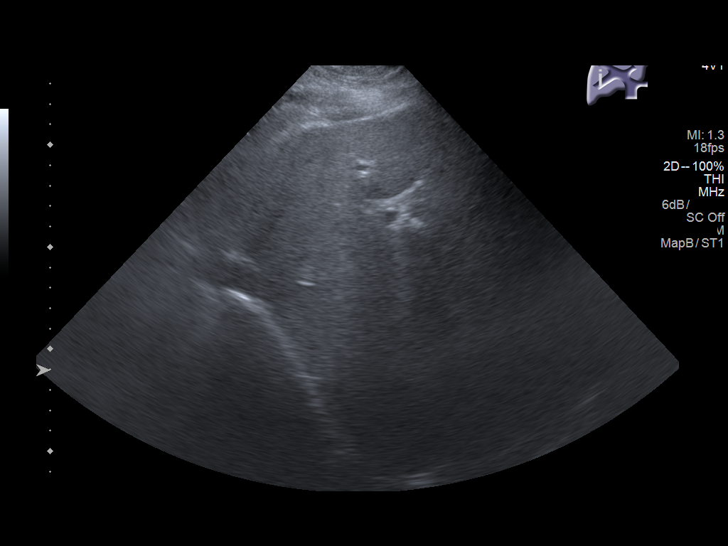
[im 22/30]
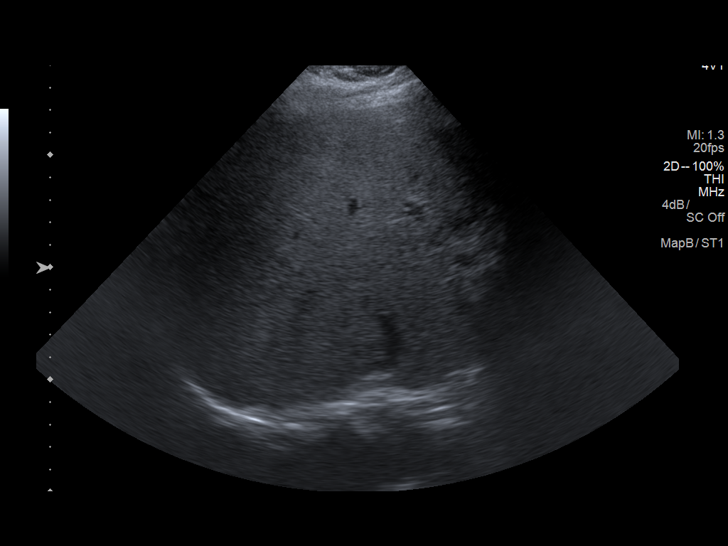
[im 25/30]
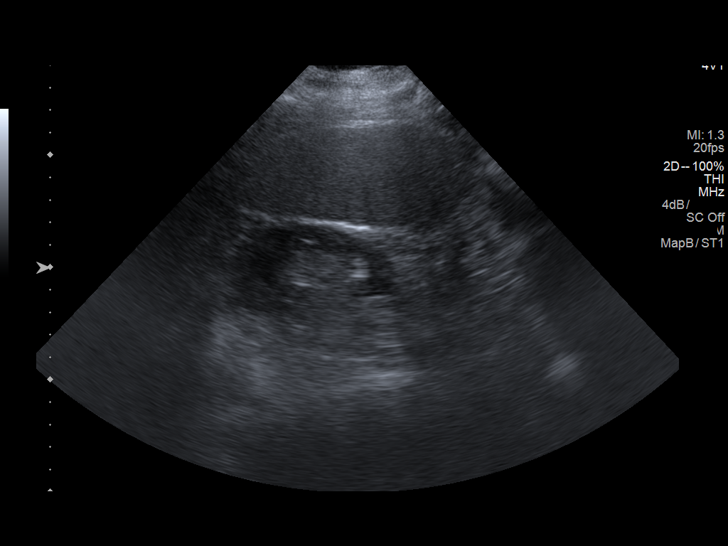
[im 27/30]
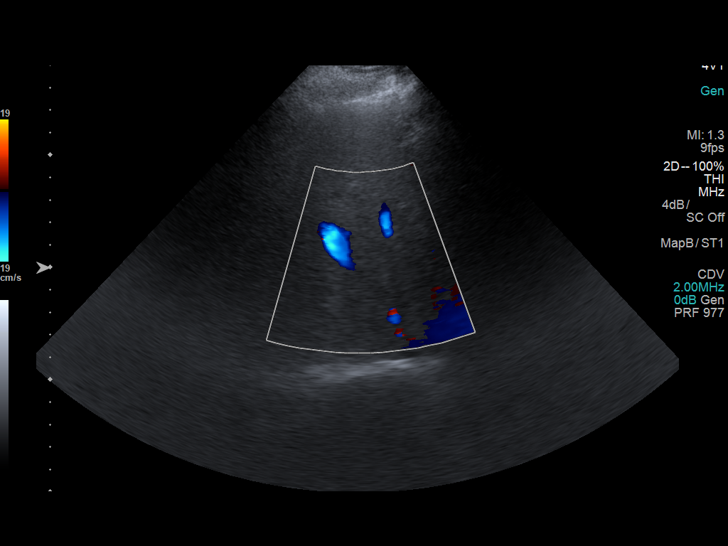
[im 30/30]
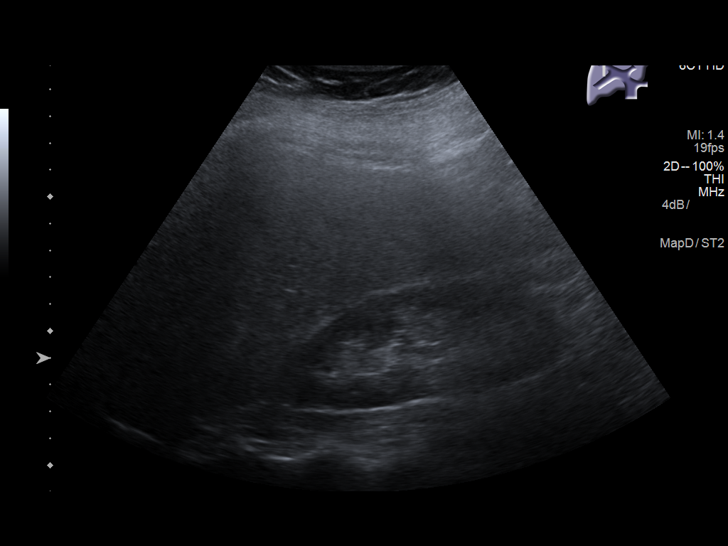

[14 of 25 positions shown; findings below may reference images not displayed]

FINDINGS: Gallbladder:

Surgically absent.

Common bile duct:

Diameter: 2.7 mm

Liver:

No focal lesion identified. Mildly increased parenchymal
echogenicity. Portal vein is patent on color Doppler imaging with
normal direction of blood flow towards the liver.
IMPRESSION: Mildly increased parenchymal echogenicity of the liver, usually seen
with hepatic steatosis.

## 2020-10-02 ENCOUNTER — Other Ambulatory Visit: Payer: Self-pay

## 2020-10-02 ENCOUNTER — Ambulatory Visit (INDEPENDENT_AMBULATORY_CARE_PROVIDER_SITE_OTHER): Payer: Medicare HMO | Admitting: Dermatology

## 2020-10-02 DIAGNOSIS — D229 Melanocytic nevi, unspecified: Secondary | ICD-10-CM

## 2020-10-02 DIAGNOSIS — L814 Other melanin hyperpigmentation: Secondary | ICD-10-CM | POA: Diagnosis not present

## 2020-10-02 DIAGNOSIS — Z1283 Encounter for screening for malignant neoplasm of skin: Secondary | ICD-10-CM | POA: Diagnosis not present

## 2020-10-02 DIAGNOSIS — L821 Other seborrheic keratosis: Secondary | ICD-10-CM

## 2020-10-02 DIAGNOSIS — L578 Other skin changes due to chronic exposure to nonionizing radiation: Secondary | ICD-10-CM

## 2020-10-02 DIAGNOSIS — L570 Actinic keratosis: Secondary | ICD-10-CM | POA: Diagnosis not present

## 2020-10-02 DIAGNOSIS — L304 Erythema intertrigo: Secondary | ICD-10-CM | POA: Diagnosis not present

## 2020-10-02 DIAGNOSIS — D1800 Hemangioma unspecified site: Secondary | ICD-10-CM

## 2020-10-02 NOTE — Progress Notes (Signed)
Follow-Up Visit   Subjective  Autumn Bird is a 65 y.o. female who presents for the following: Annual Exam. Patient has a hx of AK's but no hx of skin cancer or dysplastic nevi.  The patient presents for Total-Body Skin Exam (TBSE) for skin cancer screening and mole check.  The following portions of the chart were reviewed this encounter and updated as appropriate:   Tobacco  Allergies  Meds  Problems  Med Hx  Surg Hx  Fam Hx     Review of Systems:  No other skin or systemic complaints except as noted in HPI or Assessment and Plan.  Objective  Well appearing patient in no apparent distress; mood and affect are within normal limits.  A full examination was performed including scalp, head, eyes, ears, nose, lips, neck, chest, axillae, abdomen, back, buttocks, bilateral upper extremities, bilateral lower extremities, hands, feet, fingers, toes, fingernails, and toenails. All findings within normal limits unless otherwise noted below.  R temple x 3, nasal bridge x 1 (4) Erythematous thin papules/macules with gritty scale.   B/L inframammary Erythema.   Assessment & Plan  AK (actinic keratosis) (4) R temple x 3, nasal bridge x 1  Destruction of lesion - R temple x 3, nasal bridge x 1 Complexity: simple   Destruction method: cryotherapy   Informed consent: discussed and consent obtained   Timeout:  patient name, date of birth, surgical site, and procedure verified Lesion destroyed using liquid nitrogen: Yes   Region frozen until ice ball extended beyond lesion: Yes   Outcome: patient tolerated procedure well with no complications   Post-procedure details: wound care instructions given    Erythema intertrigo B/L inframammary Intertrigo is a chronic recurrent rash that occurs in skin fold areas that may be associated with friction; heat; moisture; yeast; fungus; and bacteria.  It is exacerbated by increased movement / activity; sweating; and higher atmospheric  temperature. Discussed skin medicinals intertrigo topical medication. Not bothersome to patient; patient declined treatment.  Skin cancer screening  Lentigines - Scattered tan macules - Due to sun exposure - Benign-appering, observe - Recommend daily broad spectrum sunscreen SPF 30+ to sun-exposed areas, reapply every 2 hours as needed. - Call for any changes  Seborrheic Keratoses - Stuck-on, waxy, tan-brown papules and/or plaques  - Benign-appearing - Discussed benign etiology and prognosis. - Observe - Call for any changes  Melanocytic Nevi - Tan-brown and/or pink-flesh-colored symmetric macules and papules - Benign appearing on exam today - Observation - Call clinic for new or changing moles - Recommend daily use of broad spectrum spf 30+ sunscreen to sun-exposed areas.   Hemangiomas - Red papules - Discussed benign nature - Observe - Call for any changes  Actinic Damage - Severe, confluent actinic changes with pre-cancerous actinic keratoses  - Severe, chronic, not at goal, secondary to cumulative UV radiation exposure over time - diffuse scaly erythematous macules and papules with underlying dyspigmentation - Discussed Prescription "Field Treatment" for Severe, Chronic Confluent Actinic Changes with Pre-Cancerous Actinic Keratoses Field treatment involves treatment of an entire area of skin that has confluent Actinic Changes (Sun/ Ultraviolet light damage) and PreCancerous Actinic Keratoses by method of PhotoDynamic Therapy (PDT) and/or prescription Topical Chemotherapy agents such as 5-fluorouracil, 5-fluorouracil/calcipotriene, and/or imiquimod.  The purpose is to decrease the number of clinically evident and subclinical PreCancerous lesions to prevent progression to development of skin cancer by chemically destroying early precancer changes that may or may not be visible.  It has been shown to reduce  the risk of developing skin cancer in the treated area. As a result of  treatment, redness, scaling, crusting, and open sores may occur during treatment course. One or more than one of these methods may be used and may have to be used several times to control, suppress and eliminate the PreCancerous changes. Discussed treatment course, expected reaction, and possible side effects. - Recommend daily broad spectrum sunscreen SPF 30+ to sun-exposed areas, reapply every 2 hours as needed.  - Staying in the shade or wearing long sleeves, sun glasses (UVA+UVB protection) and wide brim hats (4-inch brim around the entire circumference of the hat) are also recommended. - Call for new or changing lesions.  - Start 5FU/Calcipotriene mix BID x 1 week to the temples and nose.   Skin cancer screening performed today.  Return in about 6 months (around 04/04/2021) for AK follow up .  Luther Redo, CMA, am acting as scribe for Sarina Ser, MD . Documentation: I have reviewed the above documentation for accuracy and completeness, and I agree with the above.  Sarina Ser, MD

## 2020-10-02 NOTE — Patient Instructions (Addendum)
If you have any questions or concerns for your doctor, please call our main line at [phone] and press option 4 to reach your doctor's medical assistant. If no one answers, please leave a voicemail as directed and we will return your call as soon as possible. Messages left after 4 pm will be answered the following business day.   You may also send us a message via MyChart. We typically respond to MyChart messages within 1-2 business days.  For prescription refills, please ask your pharmacy to contact our office. Our fax number is [phone].  If you have an urgent issue when the clinic is closed that cannot wait until the next business day, you can page your doctor at the number below.    Please note that while we do our best to be available for urgent issues outside of office hours, we are not available 24/7.   If you have an urgent issue and are unable to reach us, you may choose to seek medical care at your doctor's office, retail clinic, urgent care center, or emergency room.  If you have a medical emergency, please immediately call 911 or go to the emergency department.  Pager Numbers  - Dr. Kowalski: [phone]  - Dr. Moye: [phone]  - Dr. Stewart: [phone]  In the event of inclement weather, please call our main line at [phone] for an update on the status of any delays or closures.  Dermatology Medication Tips: Please keep the boxes that topical medications come in in order to help keep track of the instructions about where and how to use these. Pharmacies typically print the medication instructions only on the boxes and not directly on the medication tubes.   If your medication is too expensive, please contact our office at [phone] option 4 or send us a message through MyChart.   We are unable to tell what your co-pay for medications will be in advance as this is different depending on your insurance coverage. However, we may be able to find a substitute  medication at lower cost or fill out paperwork to get insurance to cover a needed medication.   If a prior authorization is required to get your medication covered by your insurance company, please allow us 1-2 business days to complete this process.  Drug prices often vary depending on where the prescription is filled and some pharmacies may offer cheaper prices.  The website www.goodrx.com contains coupons for medications through different pharmacies. The prices here do not account for what the cost may be with help from insurance (it may be cheaper with your insurance), but the website can give you the price if you did not use any insurance.  - You can print the associated coupon and take it with your prescription to the pharmacy.  - You may also stop by our office during regular business hours and pick up a GoodRx coupon card.  - If you need your prescription sent electronically to a different pharmacy, notify our office through [facility] MyChart or by phone at [phone] option 4.  Instructions for [facility] Medications  One or more of your medications was sent to the [facility] mail order compounding pharmacy. You will receive an email from them and can purchase the medicine through that link. It will then be mailed to your home at the address you confirmed. If for any reason you do not receive an email from them, please check your spam folder. If you still do not find the email,   please let us know. [facility] phone number is [phone].   

## 2020-10-03 ENCOUNTER — Encounter: Payer: Self-pay | Admitting: Dermatology

## 2021-04-02 ENCOUNTER — Other Ambulatory Visit: Payer: Self-pay

## 2021-04-02 ENCOUNTER — Ambulatory Visit: Payer: Medicare HMO | Admitting: Dermatology

## 2021-04-02 DIAGNOSIS — L570 Actinic keratosis: Secondary | ICD-10-CM | POA: Diagnosis not present

## 2021-04-02 DIAGNOSIS — L578 Other skin changes due to chronic exposure to nonionizing radiation: Secondary | ICD-10-CM

## 2021-04-02 NOTE — Progress Notes (Signed)
° °  Follow-Up Visit   Subjective  Autumn Bird is a 66 y.o. female who presents for the following: Actinic Keratosis (Of the face - patient forgot to order the 5FU/Calcipotriene cream. ). The patient has spots, moles and lesions to be evaluated, some may be new.   The following portions of the chart were reviewed this encounter and updated as appropriate:   Tobacco   Allergies   Meds   Problems   Med Hx   Surg Hx   Fam Hx      Review of Systems:  No other skin or systemic complaints except as noted in HPI or Assessment and Plan.  Objective  Well appearing patient in no apparent distress; mood and affect are within normal limits.  A focused examination was performed including the face, arms, hands. Relevant physical exam findings are noted in the Assessment and Plan.  R temple, R upper lip, L temple x 2 (4) Erythematous thin papules/macules with gritty scale.    Assessment & Plan  AK (actinic keratosis) (4) R temple, R upper lip, L temple x 2  Destruction of lesion - R temple, R upper lip, L temple x 2 Complexity: simple   Destruction method: cryotherapy   Informed consent: discussed and consent obtained   Timeout:  patient name, date of birth, surgical site, and procedure verified Lesion destroyed using liquid nitrogen: Yes   Region frozen until ice ball extended beyond lesion: Yes   Outcome: patient tolerated procedure well with no complications   Post-procedure details: wound care instructions given    Actinic Damage - Severe, confluent actinic changes with pre-cancerous actinic keratoses  - Severe, chronic, not at goal, secondary to cumulative UV radiation exposure over time - diffuse scaly erythematous macules and papules with underlying dyspigmentation - Discussed Prescription "Field Treatment" for Severe, Chronic Confluent Actinic Changes with Pre-Cancerous Actinic Keratoses Field treatment involves treatment of an entire area of skin that has confluent Actinic Changes  (Sun/ Ultraviolet light damage) and PreCancerous Actinic Keratoses by method of PhotoDynamic Therapy (PDT) and/or prescription Topical Chemotherapy agents such as 5-fluorouracil, 5-fluorouracil/calcipotriene, and/or imiquimod.  The purpose is to decrease the number of clinically evident and subclinical PreCancerous lesions to prevent progression to development of skin cancer by chemically destroying early precancer changes that may or may not be visible.  It has been shown to reduce the risk of developing skin cancer in the treated area. As a result of treatment, redness, scaling, crusting, and open sores may occur during treatment course. One or more than one of these methods may be used and may have to be used several times to control, suppress and eliminate the PreCancerous changes. Discussed treatment course, expected reaction, and possible side effects. - Recommend daily broad spectrum sunscreen SPF 30+ to sun-exposed areas, reapply every 2 hours as needed.  - Staying in the shade or wearing long sleeves, sun glasses (UVA+UVB protection) and wide brim hats (4-inch brim around the entire circumference of the hat) are also recommended. - Call for new or changing lesions. - Start 5FU/Calcipotriene mix BID x 7 days to the temples and nose.  Return in about 1 year (around 04/02/2022) for TBSE.  Luther Redo, CMA, am acting as scribe for Sarina Ser, MD . Documentation: I have reviewed the above documentation for accuracy and completeness, and I agree with the above.  Sarina Ser, MD

## 2021-04-02 NOTE — Patient Instructions (Addendum)
- Start 5-fluorouracil/calcipotriene cream twice a day for 7 days to affected areas including the temples and nose. Prescription sent to [facility]. Patient advised they will receive an email to purchase the medication online and have it sent to their home. Patient provided with handout reviewing treatment course and side effects and advised to call or message [place] on MyChart with any concerns.   5-fluorouracil/calcipotriene cream is is a type of field treatment used to treat precancers, thin skin cancers, and areas of sun damage. Expected reaction includes irritation and mild inflammation potentially progressing to more severe inflammation including redness, scaling, crusting and open sores/erosions.  If too much irritation occurs, ensure application of only a thin layer and decrease frequency of use to achieve a tolerable level of inflammation. Recommend applying Vaseline ointment to open sores as needed.  Minimize sun exposure while under treatment. Recommend daily broad spectrum sunscreen SPF 30+ to sun-exposed areas, reapply every 2 hours as needed.   Instructions for [facility]  One or more of your medications was sent to the [facility]. You will receive an email from them and can purchase the medicine through that link. It will then be mailed to your home at the address you confirmed. If for any reason you do not receive an email from them, please check your spam folder. If you still do not find the email, please let us know. [facility] phone number is [phone].  If You Need Anything After Your Visit  If you have any questions or concerns for your doctor, please call our main line at [phone] and press option 4 to reach your doctor's medical assistant. If no one answers, please leave a voicemail as directed and we will return your call as soon as possible. Messages left after 4 pm will be answered the  following business day.   You may also send [place] a message via [place]. We typically respond to MyChart messages within 1-2 business days.  For prescription refills, please ask your pharmacy to contact our office. Our fax number is [phone].  If you have an urgent issue when the clinic is closed that cannot wait until the next business day, you can page your doctor at the number below.    Please note that while we do our best to be available for urgent issues outside of office hours, we are not available 24/7.   If you have an urgent issue and are unable to reach [place], you may choose to seek medical care at your doctor's office, retail clinic, urgent care center, or emergency room.  If you have a medical emergency, please immediately call 911 or go to the emergency department.  Pager Numbers  - Dr. Nehemiah Massed: [phone]  - Dr. Laurence Ferrari: [phone]  - Dr. Nicole Kindred: [phone]  In the event of inclement weather, please call our main line at [phone] for an update on the status of any delays or closures.  Dermatology Medication Tips: Please keep the boxes that topical medications come in in order to help keep track of the instructions about where and how to use these. Pharmacies typically print the medication instructions only on the boxes and not directly on the medication tubes.   If your medication is too expensive, please contact our office at [phone] option 4 or send [place] a message through [place].   We are unable to tell what your co-pay for medications will be in advance as this is different depending on your  insurance coverage. However, we may be able to find a substitute medication at lower cost or fill out paperwork to get insurance to cover a needed medication.   If a prior authorization is required to get your medication covered by your insurance company, please allow [place] 1-2 business days to complete this process.  Drug prices often vary depending on where the  prescription is filled and some pharmacies may offer cheaper prices.  The website www.goodrx.com contains coupons for medications through different pharmacies. The prices here do not account for what the cost may be with help from insurance (it may be cheaper with your insurance), but the website can give you the price if you did not use any insurance.  - You can print the associated coupon and take it with your prescription to the pharmacy.  - You may also stop by our office during regular business hours and pick up a GoodRx coupon card.  - If you need your prescription sent electronically to a different pharmacy, notify our office through [facility] or by phone at [phone] option 4.     Si Usted Necesita Algo Despus de Su Visita  Tambin puede enviarnos un mensaje a travs de Pharmacist, community. Por lo general respondemos a los mensajes de MyChart en el transcurso de 1 a 2 das hbiles.  Para renovar recetas, por favor pida a su farmacia que se ponga en contacto con nuestra oficina. Harland Dingwall de fax es [place] [phone].  Si tiene un asunto urgente cuando la clnica est cerrada y que no puede esperar hasta el siguiente da hbil, puede llamar/localizar a su doctor(a) al nmero que aparece a continuacin.   Por favor, tenga en cuenta que aunque hacemos todo lo posible para estar disponibles para asuntos urgentes fuera del horario de [place], no estamos disponibles las 24 horas del da, los 7 das de la [place].   Si tiene un problema urgente y no puede comunicarse con nosotros, puede optar por buscar atencin mdica  en el consultorio de su doctor(a), en una clnica privada, en un centro de atencin urgente o en una sala de emergencias.  Si tiene Engineering geologist, por favor llame inmediatamente al 911 o vaya a la sala de emergencias.  Nmeros de bper  - Dr. Nehemiah Massed: [phone]  - Dra. Moye: [phone]  - Dra. Nicole Kindred: [phone]  En caso de inclemencias del [place],  por favor llame a Johnsie Kindred principal al [phone] para una actualizacin sobre el [place] de cualquier retraso o cierre.  Consejos para la medicacin en dermatologa: Por favor, guarde las cajas en las que vienen los medicamentos de uso tpico para ayudarle a seguir las instrucciones sobre dnde y cmo usarlos. Las farmacias generalmente imprimen las instrucciones del medicamento slo en las cajas y no directamente en los tubos del [place].   Si su medicamento es muy caro, por favor, pngase en contacto con Zigmund Daniel llamando al [phone] y presione la opcin 4 o envenos un mensaje a travs de Pharmacist, community.   No podemos decirle cul ser su copago por los medicamentos por adelantado ya que esto es diferente dependiendo de la cobertura de su seguro. Sin embargo, es posible que podamos encontrar un medicamento sustituto a Electrical engineer un formulario para que el seguro cubra el medicamento que se considera necesario.   Si se requiere una autorizacin previa para que su compaa de seguros [place] su medicamento, por favor permtanos de 1 a 2 das hbiles para completar este proceso.  Los  precios de los medicamentos varan con frecuencia dependiendo del lugar de dnde se surte la receta y alguna farmacias pueden ofrecer precios ms baratos.  El sitio web www.goodrx.com tiene cupones para medicamentos de Airline pilot. Los precios aqu no tienen en cuenta lo que podra costar con la ayuda del seguro (puede ser ms barato con su seguro), pero el sitio web puede darle el precio si no utiliz Research scientist (physical sciences).  - Puede imprimir el cupn correspondiente y llevarlo con su receta a la farmacia.  - Tambin puede pasar por nuestra oficina durante el horario de atencin regular y Charity fundraiser una tarjeta de cupones de GoodRx.  - Si necesita que su receta se enve electrnicamente a una farmacia diferente, informe a nuestra oficina a travs de MyChart de [place] o por telfono llamando al  [phone] y presione la opcin 4.

## 2021-04-03 ENCOUNTER — Encounter: Payer: Self-pay | Admitting: Dermatology

## 2021-06-05 ENCOUNTER — Other Ambulatory Visit: Payer: Self-pay | Admitting: Internal Medicine

## 2021-06-05 DIAGNOSIS — Z1231 Encounter for screening mammogram for malignant neoplasm of breast: Secondary | ICD-10-CM | POA: Diagnosis not present

## 2021-06-05 DIAGNOSIS — Z0000 Encounter for general adult medical examination without abnormal findings: Secondary | ICD-10-CM | POA: Diagnosis not present

## 2021-06-05 DIAGNOSIS — K219 Gastro-esophageal reflux disease without esophagitis: Secondary | ICD-10-CM | POA: Diagnosis not present

## 2021-06-05 DIAGNOSIS — I10 Essential (primary) hypertension: Secondary | ICD-10-CM | POA: Diagnosis not present

## 2021-06-05 DIAGNOSIS — E7800 Pure hypercholesterolemia, unspecified: Secondary | ICD-10-CM | POA: Diagnosis not present

## 2021-06-05 DIAGNOSIS — R7303 Prediabetes: Secondary | ICD-10-CM | POA: Diagnosis not present

## 2021-08-06 DIAGNOSIS — H00015 Hordeolum externum left lower eyelid: Secondary | ICD-10-CM | POA: Diagnosis not present

## 2021-09-05 DIAGNOSIS — I10 Essential (primary) hypertension: Secondary | ICD-10-CM | POA: Diagnosis not present

## 2021-10-04 DIAGNOSIS — I10 Essential (primary) hypertension: Secondary | ICD-10-CM | POA: Diagnosis not present

## 2021-10-04 DIAGNOSIS — E7800 Pure hypercholesterolemia, unspecified: Secondary | ICD-10-CM | POA: Diagnosis not present

## 2021-10-04 DIAGNOSIS — R7303 Prediabetes: Secondary | ICD-10-CM | POA: Diagnosis not present

## 2022-01-29 DIAGNOSIS — H04123 Dry eye syndrome of bilateral lacrimal glands: Secondary | ICD-10-CM | POA: Diagnosis not present

## 2022-01-29 DIAGNOSIS — H524 Presbyopia: Secondary | ICD-10-CM | POA: Diagnosis not present

## 2022-01-29 DIAGNOSIS — H259 Unspecified age-related cataract: Secondary | ICD-10-CM | POA: Diagnosis not present

## 2022-01-29 DIAGNOSIS — H5203 Hypermetropia, bilateral: Secondary | ICD-10-CM | POA: Diagnosis not present

## 2022-01-29 DIAGNOSIS — H52223 Regular astigmatism, bilateral: Secondary | ICD-10-CM | POA: Diagnosis not present

## 2022-03-28 DIAGNOSIS — I10 Essential (primary) hypertension: Secondary | ICD-10-CM | POA: Diagnosis not present

## 2022-03-28 DIAGNOSIS — R7303 Prediabetes: Secondary | ICD-10-CM | POA: Diagnosis not present

## 2022-03-28 DIAGNOSIS — E7800 Pure hypercholesterolemia, unspecified: Secondary | ICD-10-CM | POA: Diagnosis not present

## 2022-04-04 ENCOUNTER — Ambulatory Visit: Payer: Medicare HMO | Admitting: Dermatology

## 2022-04-23 ENCOUNTER — Other Ambulatory Visit: Payer: Self-pay | Admitting: Internal Medicine

## 2022-04-23 ENCOUNTER — Ambulatory Visit
Admission: RE | Admit: 2022-04-23 | Discharge: 2022-04-23 | Disposition: A | Discharge status: Home / Self Care | Payer: Medicare HMO | Source: Ambulatory Visit | Attending: Internal Medicine | Admitting: Internal Medicine

## 2022-04-23 DIAGNOSIS — R270 Ataxia, unspecified: Secondary | ICD-10-CM

## 2022-04-23 DIAGNOSIS — H5310 Unspecified subjective visual disturbances: Secondary | ICD-10-CM | POA: Diagnosis not present

## 2022-04-29 ENCOUNTER — Ambulatory Visit: Payer: Medicare HMO | Admitting: Dermatology

## 2022-07-25 ENCOUNTER — Ambulatory Visit: Payer: Medicare HMO | Admitting: Dermatology

## 2022-07-25 VITALS — BP 131/79

## 2022-07-25 DIAGNOSIS — L814 Other melanin hyperpigmentation: Secondary | ICD-10-CM

## 2022-07-25 DIAGNOSIS — L570 Actinic keratosis: Secondary | ICD-10-CM | POA: Diagnosis not present

## 2022-07-25 DIAGNOSIS — D1801 Hemangioma of skin and subcutaneous tissue: Secondary | ICD-10-CM | POA: Diagnosis not present

## 2022-07-25 DIAGNOSIS — X32XXXA Exposure to sunlight, initial encounter: Secondary | ICD-10-CM

## 2022-07-25 DIAGNOSIS — L821 Other seborrheic keratosis: Secondary | ICD-10-CM | POA: Diagnosis not present

## 2022-07-25 DIAGNOSIS — D229 Melanocytic nevi, unspecified: Secondary | ICD-10-CM

## 2022-07-25 DIAGNOSIS — Z1283 Encounter for screening for malignant neoplasm of skin: Secondary | ICD-10-CM | POA: Diagnosis not present

## 2022-07-25 DIAGNOSIS — W908XXA Exposure to other nonionizing radiation, initial encounter: Secondary | ICD-10-CM | POA: Diagnosis not present

## 2022-07-25 DIAGNOSIS — L820 Inflamed seborrheic keratosis: Secondary | ICD-10-CM

## 2022-07-25 DIAGNOSIS — L578 Other skin changes due to chronic exposure to nonionizing radiation: Secondary | ICD-10-CM | POA: Diagnosis not present

## 2022-07-25 NOTE — Patient Instructions (Signed)
Cryotherapy Aftercare  Wash gently with soap and water everyday.   Apply Vaseline and Band-Aid daily until healed.     Due to recent changes in healthcare laws, you may see results of your pathology and/or laboratory studies on MyChart before the doctors have had a chance to review them. We understand that in some cases there may be results that are confusing or concerning to you. Please understand that not all results are received at the same time and often the doctors may need to interpret multiple results in order to provide you with the best plan of care or course of treatment. Therefore, we ask that you please give us 2 business days to thoroughly review all your results before contacting the office for clarification. Should we see a critical lab result, you will be contacted sooner.   If You Need Anything After Your Visit  If you have any questions or concerns for your doctor, please call our main line at [phone] and press option 4 to reach your doctor's medical assistant. If no one answers, please leave a voicemail as directed and we will return your call as soon as possible. Messages left after 4 pm will be answered the following business day.   You may also send us a message via MyChart. We typically respond to MyChart messages within 1-2 business days.  For prescription refills, please ask your pharmacy to contact our office. Our fax number is [phone].  If you have an urgent issue when the clinic is closed that cannot wait until the next business day, you can page your doctor at the number below.    Please note that while we do our best to be available for urgent issues outside of office hours, we are not available 24/7.   If you have an urgent issue and are unable to reach us, you may choose to seek medical care at your doctor's office, retail clinic, urgent care center, or emergency room.  If you have a medical emergency, please immediately call 911 or go to the  emergency department.  Pager Numbers  - Dr. Kowalski: [phone]  - Dr. Moye: [phone]  - Dr. Stewart: [phone]  In the event of inclement weather, please call our main line at [phone] for an update on the status of any delays or closures.  Dermatology Medication Tips: Please keep the boxes that topical medications come in in order to help keep track of the instructions about where and how to use these. Pharmacies typically print the medication instructions only on the boxes and not directly on the medication tubes.   If your medication is too expensive, please contact our office at [phone] option 4 or send us a message through MyChart.   We are unable to tell what your co-pay for medications will be in advance as this is different depending on your insurance coverage. However, we may be able to find a substitute medication at lower cost or fill out paperwork to get insurance to cover a needed medication.   If a prior authorization is required to get your medication covered by your insurance company, please allow us 1-2 business days to complete this process.  Drug prices often vary depending on where the prescription is filled and some pharmacies may offer cheaper prices.  The website www.goodrx.com contains coupons for medications through different pharmacies. The prices here do not account for what the cost may be with help from insurance (it may be cheaper with your insurance), but the website can   give you the price if you did not use any insurance.  - You can print the associated coupon and take it with your prescription to the pharmacy.  - You may also stop by our office during regular business hours and pick up a GoodRx coupon card.  - If you need your prescription sent electronically to a different pharmacy, notify our office through [place] MyChart or by phone at [phone] option 4.     Si Usted Necesita Algo Despus de Su Visita  Tambin puede  enviarnos un mensaje a travs de MyChart. Por lo general respondemos a los mensajes de MyChart en el transcurso de 1 a 2 das hbiles.  Para renovar recetas, por favor pida a su farmacia que se ponga en contacto con nuestra oficina. Nuestro nmero de fax es el [phone].  Si tiene un asunto urgente cuando la clnica est cerrada y que no puede esperar hasta el siguiente da hbil, puede llamar/localizar a su doctor(a) al nmero que aparece a continuacin.   Por favor, tenga en cuenta que aunque hacemos todo lo posible para estar disponibles para asuntos urgentes fuera del horario de oficina, no estamos disponibles las 24 horas del da, los 7 das de la semana.   Si tiene un problema urgente y no puede comunicarse con nosotros, puede optar por buscar atencin mdica  en el consultorio de su doctor(a), en una clnica privada, en un centro de atencin urgente o en una sala de emergencias.  Si tiene una emergencia mdica, por favor llame inmediatamente al 911 o vaya a la sala de emergencias.  Nmeros de bper  - Dr. Kowalski: [phone]  - Dra. Moye: [phone]  - Dra. Stewart: [phone]  En caso de inclemencias del tiempo, por favor llame a nuestra lnea principal al [phone] para una actualizacin sobre el estado de cualquier retraso o cierre.  Consejos para la medicacin en dermatologa: Por favor, guarde las cajas en las que vienen los medicamentos de uso tpico para ayudarle a seguir las instrucciones sobre dnde y cmo usarlos. Las farmacias generalmente imprimen las instrucciones del medicamento slo en las cajas y no directamente en los tubos del medicamento.   Si su medicamento es muy caro, por favor, pngase en contacto con nuestra oficina llamando al [phone] y presione la opcin 4 o envenos un mensaje a travs de MyChart.   No podemos decirle cul ser su copago por los medicamentos por adelantado ya que esto es diferente dependiendo de la cobertura de su seguro.  Sin embargo, es posible que podamos encontrar un medicamento sustituto a menor costo o llenar un formulario para que el seguro cubra el medicamento que se considera necesario.   Si se requiere una autorizacin previa para que su compaa de seguros cubra su medicamento, por favor permtanos de 1 a 2 das hbiles para completar este proceso.  Los precios de los medicamentos varan con frecuencia dependiendo del lugar de dnde se surte la receta y alguna farmacias pueden ofrecer precios ms baratos.  El sitio web www.goodrx.com tiene cupones para medicamentos de diferentes farmacias. Los precios aqu no tienen en cuenta lo que podra costar con la ayuda del seguro (puede ser ms barato con su seguro), pero el sitio web puede darle el precio si no utiliz ningn seguro.  - Puede imprimir el cupn correspondiente y llevarlo con su receta a la farmacia.  - Tambin puede pasar por nuestra oficina durante el horario de atencin regular y recoger una tarjeta de cupones de GoodRx.  -   Si necesita que su receta se enve electrnicamente a una farmacia diferente, informe a nuestra oficina a travs de MyChart de [place] o por telfono llamando al [phone] y presione la opcin 4.  

## 2022-07-25 NOTE — Progress Notes (Signed)
Follow-Up Visit   Subjective  Autumn Bird is a 67 y.o. female who presents for the following: Skin Cancer Screening and Full Body Skin Exam - Spot of right knee that she would like checked  Accompanied by husband   The patient presents for Total-Body Skin Exam (TBSE) for skin cancer screening and mole check. The patient has spots, moles and lesions to be evaluated, some may be new or changing and the patient has concerns that these could be cancer.    The following portions of the chart were reviewed this encounter and updated as appropriate: medications, allergies, medical history  Review of Systems:  No other skin or systemic complaints except as noted in HPI or Assessment and Plan.  Objective  Well appearing patient in no apparent distress; mood and affect are within normal limits.  A full examination was performed including scalp, head, eyes, ears, nose, lips, neck, chest, axillae, abdomen, back, buttocks, bilateral upper extremities, bilateral lower extremities, hands, feet, fingers, toes, fingernails, and toenails. All findings within normal limits unless otherwise noted below.   Relevant physical exam findings are noted in the Assessment and Plan.  Right Knee x1, right hand base of thumb x 1 (2) Erythematous stuck-on, waxy papule or plaque  Face (6) Erythematous thin papules/macules with gritty scale.     Assessment & Plan   LENTIGINES, SEBORRHEIC KERATOSES, HEMANGIOMAS - Benign normal skin lesions - Benign-appearing - Call for any changes  MELANOCYTIC NEVI - Tan-brown and/or pink-flesh-colored symmetric macules and papules - Benign appearing on exam today - Observation - Call clinic for new or changing moles - Recommend daily use of broad spectrum spf 30+ sunscreen to sun-exposed areas.   ACTINIC DAMAGE - Chronic condition, secondary to cumulative UV/sun exposure - diffuse scaly erythematous macules with underlying dyspigmentation - Recommend daily broad  spectrum sunscreen SPF 30+ to sun-exposed areas, reapply every 2 hours as needed.  - Staying in the shade or wearing long sleeves, sun glasses (UVA+UVB protection) and wide brim hats (4-inch brim around the entire circumference of the hat) are also recommended for sun protection.  - Call for new or changing lesions.  SKIN CANCER SCREENING PERFORMED TODAY.    Inflamed seborrheic keratosis (2) Right Knee x1, right hand base of thumb x 1  Symptomatic, irritating, patient would like treated.  Benign-appearing.  Call clinic for new or changing lesions.   Prior to procedure, discussed risks of blister formation, small wound, skin dyspigmentation, or rare scar following treatment. Recommend Vaseline ointment to treated areas while healing.  Recheck on follow up  Destruction of lesion - Right Knee x1, right hand base of thumb x 1 Complexity: simple   Destruction method: cryotherapy   Informed consent: discussed and consent obtained   Timeout:  patient name, date of birth, surgical site, and procedure verified Lesion destroyed using liquid nitrogen: Yes   Region frozen until ice ball extended beyond lesion: Yes   Outcome: patient tolerated procedure well with no complications   Post-procedure details: wound care instructions given    AK (actinic keratosis) (6) Face  Destruction of lesion - Face Complexity: simple   Destruction method: cryotherapy   Informed consent: discussed and consent obtained   Timeout:  patient name, date of birth, surgical site, and procedure verified Lesion destroyed using liquid nitrogen: Yes   Region frozen until ice ball extended beyond lesion: Yes   Outcome: patient tolerated procedure well with no complications   Post-procedure details: wound care instructions given  Return in about 1 year (around 07/25/2023) for TBSE, AK follow up, ISK follow up.  I, Joanie Coddington, CMA, am acting as scribe for Armida Sans, MD .   Documentation: I have reviewed  the above documentation for accuracy and completeness, and I agree with the above.  Armida Sans, MD

## 2022-08-06 ENCOUNTER — Encounter: Payer: Self-pay | Admitting: Dermatology

## 2022-12-10 DIAGNOSIS — E119 Type 2 diabetes mellitus without complications: Secondary | ICD-10-CM | POA: Diagnosis not present

## 2022-12-10 DIAGNOSIS — E7800 Pure hypercholesterolemia, unspecified: Secondary | ICD-10-CM | POA: Diagnosis not present

## 2022-12-10 DIAGNOSIS — I10 Essential (primary) hypertension: Secondary | ICD-10-CM | POA: Diagnosis not present

## 2022-12-10 DIAGNOSIS — Z2821 Immunization not carried out because of patient refusal: Secondary | ICD-10-CM | POA: Diagnosis not present

## 2022-12-10 DIAGNOSIS — R0609 Other forms of dyspnea: Secondary | ICD-10-CM | POA: Diagnosis not present

## 2023-01-31 DIAGNOSIS — H5203 Hypermetropia, bilateral: Secondary | ICD-10-CM | POA: Diagnosis not present

## 2023-06-12 DIAGNOSIS — I10 Essential (primary) hypertension: Secondary | ICD-10-CM | POA: Diagnosis not present

## 2023-06-12 DIAGNOSIS — E119 Type 2 diabetes mellitus without complications: Secondary | ICD-10-CM | POA: Diagnosis not present

## 2023-06-12 DIAGNOSIS — Z0000 Encounter for general adult medical examination without abnormal findings: Secondary | ICD-10-CM | POA: Diagnosis not present

## 2023-06-12 DIAGNOSIS — Z1331 Encounter for screening for depression: Secondary | ICD-10-CM | POA: Diagnosis not present

## 2023-06-12 DIAGNOSIS — E7800 Pure hypercholesterolemia, unspecified: Secondary | ICD-10-CM | POA: Diagnosis not present

## 2023-08-07 ENCOUNTER — Encounter: Payer: Self-pay | Admitting: Dermatology

## 2023-08-07 ENCOUNTER — Ambulatory Visit: Payer: Medicare HMO | Admitting: Dermatology

## 2023-08-07 DIAGNOSIS — L821 Other seborrheic keratosis: Secondary | ICD-10-CM | POA: Diagnosis not present

## 2023-08-07 DIAGNOSIS — Z872 Personal history of diseases of the skin and subcutaneous tissue: Secondary | ICD-10-CM

## 2023-08-07 DIAGNOSIS — L820 Inflamed seborrheic keratosis: Secondary | ICD-10-CM | POA: Diagnosis not present

## 2023-08-07 DIAGNOSIS — Z1283 Encounter for screening for malignant neoplasm of skin: Secondary | ICD-10-CM | POA: Diagnosis not present

## 2023-08-07 DIAGNOSIS — L578 Other skin changes due to chronic exposure to nonionizing radiation: Secondary | ICD-10-CM

## 2023-08-07 DIAGNOSIS — D229 Melanocytic nevi, unspecified: Secondary | ICD-10-CM

## 2023-08-07 DIAGNOSIS — D1801 Hemangioma of skin and subcutaneous tissue: Secondary | ICD-10-CM | POA: Diagnosis not present

## 2023-08-07 DIAGNOSIS — W908XXA Exposure to other nonionizing radiation, initial encounter: Secondary | ICD-10-CM

## 2023-08-07 DIAGNOSIS — L814 Other melanin hyperpigmentation: Secondary | ICD-10-CM

## 2023-08-07 NOTE — Progress Notes (Signed)
   Follow-Up Visit   Subjective  Autumn Bird is a 68 y.o. female who presents for the following: Skin Cancer Screening and Full Body Skin Exam The patient presents for Total-Body Skin Exam (TBSE) for skin cancer screening and mole check. The patient has spots, moles and lesions to be evaluated, some may be new or changing and the patient may have concern these could be cancer.  Hx AK. Patient does have at right nose and glabella that she picks at. No hx skin cancer.   The following portions of the chart were reviewed this encounter and updated as appropriate: medications, allergies, medical history  Review of Systems:  No other skin or systemic complaints except as noted in HPI or Assessment and Plan.  Objective  Well appearing patient in no apparent distress; mood and affect are within normal limits.  A full examination was performed including scalp, head, eyes, ears, nose, lips, neck, chest, axillae, abdomen, back, buttocks, bilateral upper extremities, bilateral lower extremities, hands, feet, fingers, toes, fingernails, and toenails. All findings within normal limits unless otherwise noted below.   Relevant physical exam findings are noted in the Assessment and Plan.  R medial cheek/lateral nose x 1, R nasal root/glabella x 1 (2) Erythematous stuck-on, waxy papule or plaque  Assessment & Plan   SKIN CANCER SCREENING PERFORMED TODAY.  ACTINIC DAMAGE - Chronic condition, secondary to cumulative UV/sun exposure - diffuse scaly erythematous macules with underlying dyspigmentation - Recommend daily broad spectrum sunscreen SPF 30+ to sun-exposed areas, reapply every 2 hours as needed.  - Staying in the shade or wearing long sleeves, sun glasses (UVA+UVB protection) and wide brim hats (4-inch brim around the entire circumference of the hat) are also recommended for sun protection.  - Call for new or changing lesions.  LENTIGINES, SEBORRHEIC KERATOSES, HEMANGIOMAS - Benign normal skin  lesions - Benign-appearing - Call for any changes  MELANOCYTIC NEVI - Tan-brown and/or pink-flesh-colored symmetric macules and papules - Benign appearing on exam today - Observation - Call clinic for new or changing moles - Recommend daily use of broad spectrum spf 30+ sunscreen to sun-exposed areas.   INFLAMED SEBORRHEIC KERATOSIS (2) R medial cheek/lateral nose x 1, R nasal root/glabella x 1 (2) Symptomatic, irritating, patient would like treated.  Benign-appearing.  Call clinic for new or changing lesions.   Destruction of lesion - R medial cheek/lateral nose x 1, R nasal root/glabella x 1 (2) Complexity: simple   Destruction method: cryotherapy   Informed consent: discussed and consent obtained   Timeout:  patient name, date of birth, surgical site, and procedure verified Lesion destroyed using liquid nitrogen: Yes   Region frozen until ice ball extended beyond lesion: Yes   Outcome: patient tolerated procedure well with no complications   Post-procedure details: wound care instructions given   Return in about 1 year (around 08/06/2024) for TBSE, with Dr. Linnell Richardson, HxAK.  Kerstin Peeling, RMA, am acting as scribe for Celine Collard, MD .   Documentation: I have reviewed the above documentation for accuracy and completeness, and I agree with the above.  Celine Collard, MD

## 2023-08-07 NOTE — Patient Instructions (Signed)

## 2023-11-12 ENCOUNTER — Telehealth: Payer: Self-pay

## 2023-11-12 NOTE — Telephone Encounter (Signed)
 Copied from CRM [id number]. Topic: Appointments - Scheduling Inquiry for Clinic >> Nov 12, 2023  1:55 PM Autumn Bird wrote: Reason for CRM: Pt and husband previously saw Dr. Marylynn back in 2012 and would like to know if she could see her again. 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 She has [employer] and current PCP does not take it. She would really love to have her back as her PCP.  Husband is [name]-[birth year]  Patient callback is [phone]

## 2023-12-19 ENCOUNTER — Encounter: Payer: Self-pay | Admitting: Internal Medicine

## 2023-12-19 ENCOUNTER — Other Ambulatory Visit: Payer: Self-pay

## 2023-12-19 ENCOUNTER — Ambulatory Visit (INDEPENDENT_AMBULATORY_CARE_PROVIDER_SITE_OTHER): Admitting: Internal Medicine

## 2023-12-19 VITALS — BP 102/68 | HR 71 | Temp 98.7°F | Ht 63.5 in | Wt 172.0 lb

## 2023-12-19 DIAGNOSIS — E538 Deficiency of other specified B group vitamins: Secondary | ICD-10-CM | POA: Diagnosis not present

## 2023-12-19 DIAGNOSIS — K2270 Barrett's esophagus without dysplasia: Secondary | ICD-10-CM

## 2023-12-19 DIAGNOSIS — Z7984 Long term (current) use of oral hypoglycemic drugs: Secondary | ICD-10-CM | POA: Diagnosis not present

## 2023-12-19 DIAGNOSIS — E119 Type 2 diabetes mellitus without complications: Secondary | ICD-10-CM

## 2023-12-19 DIAGNOSIS — I10 Essential (primary) hypertension: Secondary | ICD-10-CM | POA: Diagnosis not present

## 2023-12-19 DIAGNOSIS — K760 Fatty (change of) liver, not elsewhere classified: Secondary | ICD-10-CM | POA: Diagnosis not present

## 2023-12-19 DIAGNOSIS — Z1231 Encounter for screening mammogram for malignant neoplasm of breast: Secondary | ICD-10-CM

## 2023-12-19 DIAGNOSIS — E669 Obesity, unspecified: Secondary | ICD-10-CM

## 2023-12-19 DIAGNOSIS — R5383 Other fatigue: Secondary | ICD-10-CM

## 2023-12-19 DIAGNOSIS — R748 Abnormal levels of other serum enzymes: Secondary | ICD-10-CM

## 2023-12-19 DIAGNOSIS — G2581 Restless legs syndrome: Secondary | ICD-10-CM

## 2023-12-19 LAB — IBC + FERRITIN
Ferritin: 72.2 ng/mL (ref 10.0–291.0)
Iron: 150 ug/dL — ABNORMAL HIGH (ref 42–145)
Saturation Ratios: 37.6 % (ref 20.0–50.0)
TIBC: 399 ug/dL (ref 250.0–450.0)
Transferrin: 285 mg/dL (ref 212.0–360.0)

## 2023-12-19 LAB — CBC WITH DIFFERENTIAL/PLATELET
Basophils Absolute: 0.1 K/uL (ref 0.0–0.1)
Basophils Relative: 1.6 % (ref 0.0–3.0)
Eosinophils Absolute: 0.2 K/uL (ref 0.0–0.7)
Eosinophils Relative: 3.8 % (ref 0.0–5.0)
HCT: 36.1 % (ref 36.0–46.0)
Hemoglobin: 12.1 g/dL (ref 12.0–15.0)
Lymphocytes Relative: 40.1 % (ref 12.0–46.0)
Lymphs Abs: 2.2 K/uL (ref 0.7–4.0)
MCHC: 33.6 g/dL (ref 30.0–36.0)
MCV: 103.3 fl — ABNORMAL HIGH (ref 78.0–100.0)
Monocytes Absolute: 0.5 K/uL (ref 0.1–1.0)
Monocytes Relative: 9.6 % (ref 3.0–12.0)
Neutro Abs: 2.5 K/uL (ref 1.4–7.7)
Neutrophils Relative %: 44.9 % (ref 43.0–77.0)
Platelets: 133 K/uL — ABNORMAL LOW (ref 150.0–400.0)
RBC: 3.5 Mil/uL — ABNORMAL LOW (ref 3.87–5.11)
RDW: 13.3 % (ref 11.5–15.5)
WBC: 5.6 K/uL (ref 4.0–10.5)

## 2023-12-19 LAB — LIPID PANEL
Cholesterol: 113 mg/dL (ref 0–200)
HDL: 44.1 mg/dL (ref >39.00)
LDL Cholesterol: 30 mg/dL (ref 0–99)
NonHDL: 68.55
Total CHOL/HDL Ratio: 3
Triglycerides: 192 mg/dL — ABNORMAL HIGH (ref 0.0–149.0)
VLDL: 38.4 mg/dL (ref 0.0–40.0)

## 2023-12-19 LAB — COMPREHENSIVE METABOLIC PANEL WITH GFR
ALT: 41 U/L — ABNORMAL HIGH (ref 0–35)
AST: 57 U/L — ABNORMAL HIGH (ref 0–37)
Albumin: 4.2 g/dL (ref 3.5–5.2)
Alkaline Phosphatase: 32 U/L — ABNORMAL LOW (ref 39–117)
BUN: 17 mg/dL (ref 6–23)
CO2: 27 meq/L (ref 19–32)
Calcium: 9.3 mg/dL (ref 8.4–10.5)
Chloride: 100 meq/L (ref 96–112)
Creatinine, Ser: 0.81 mg/dL (ref 0.40–1.20)
GFR: 74.51 mL/min (ref >60.00)
Glucose, Bld: 131 mg/dL — ABNORMAL HIGH (ref 70–99)
Potassium: 4 meq/L (ref 3.5–5.1)
Sodium: 138 meq/L (ref 135–145)
Total Bilirubin: 0.4 mg/dL (ref 0.2–1.2)
Total Protein: 6.9 g/dL (ref 6.0–8.3)

## 2023-12-19 LAB — MICROALBUMIN / CREATININE URINE RATIO
Creatinine,U: 164 mg/dL
Microalb Creat Ratio: 6.2 mg/g (ref 0.0–30.0)
Microalb, Ur: 1 mg/dL (ref 0.0–1.9)

## 2023-12-19 LAB — B12 AND FOLATE PANEL
Folate: >22.9 ng/mL (ref >5.9)
Vitamin B-12: 246 pg/mL (ref 211–911)

## 2023-12-19 LAB — TSH: TSH: 1.82 u[IU]/mL (ref 0.35–5.50)

## 2023-12-19 LAB — LDL CHOLESTEROL, DIRECT: Direct LDL: 47 mg/dL

## 2023-12-19 LAB — HEMOGLOBIN A1C: Hgb A1c MFr Bld: 6.4 % (ref 4.6–6.5)

## 2023-12-19 MED ORDER — ZOLPIDEM TARTRATE ER 6.25 MG PO TBCR: 6.2500 mg | EXTENDED_RELEASE_TABLET | Freq: Every evening | ORAL | 0 refills | Status: DC | PRN

## 2023-12-19 NOTE — Patient Instructions (Addendum)
 I recommend listing to the evening devotional by [email or web address] on Youtube   Ambien prescribed. DO NOT COMBINE WITH SONATA  MAMMOGRAM ORDERED.   Please call [place] to call to make your appointments  .  The phone number for [place] is  [phone]

## 2023-12-19 NOTE — Progress Notes (Signed)
 Subjective:  Patient ID: Autumn Bird, female    DOB: 11-18-1955  Age: 68 y.o. MRN: [id number]  CC: The primary encounter diagnosis was Other fatigue. Diagnoses of Hepatic steatosis, Obesity, diabetes, and hypertension syndrome (HCC), Restless legs, Breast cancer screening by mammogram, Type 2 diabetes mellitus in patient with obesity (HCC), B12 deficiency, and Barrett's esophagus without dysplasia were also pertinent to this visit.  HPI Autumn Bird presents for establishment of care   Type 2 DM complicated by hypertension, hyperlipidemia  and fatty liver,   Has managed with metformin 1000 mg bid which continues to cause daily loose stools in the morning .  Last . A1c 6.8 in April  trying to lose weight  but realized that her snacks have been sabotaged her efforts because she has been snacking on home made trail mix. She has been employing an Intermittent fasting meal pattern and has lost 8 lbs. Previous trial of Mounjaro caused severe constipation  despite using mg citrate and dulcolax   History of CVA  in 2024 : presented with vertigo and aphasia that she ignored for a week before seeking medical attention.  MRI was done in Feb 2024 noting a left basal ganglia infarct.  Losartan, asa, and rosuvastatin were started after MRI.  All symptoms resolved after 2 months  Insomnia:  has tried , both alone and in combination: relaxium,  benadryl, magnesium  glyconate, and sonata.  Has trouble falling asleep occasionally even with all 4 meds taken .  Also has RLS  untreated bc Requip caused nausea,  has neuropathy managed with Lyrica   Anxiety: managed with celexa  after multiple trials of all SRIs available, including buspirone, lexapro ,  zoloft  Describe cause of insomnia due to family tressors:  her 41 yr old son has become a  transgender female and  recently  married a female (she found out on [employer]) .  Her daughter is also starting to transgender .  Patient's mother is 53 ,  alert and oriented , lives  in an extended care facility but continues to try to to mange her  life.   History Autumn Bird has a past medical history of Allergy, Anxiety, Barrett's esophagus, Diabetes mellitus without complication (HCC), Elevated liver enzymes, Heart murmur, Hyperlipidemia, Hypertension, Insomnia, Menopause (age 32), and Stroke (HCC).   She has a past surgical history that includes Cholecystectomy (1992) and Tonsillectomy.   Her family history includes Alcohol abuse in her father; Arthritis in her mother; Cancer in her brother and father; Diabetes in her mother and sister; Early death in her father; Hearing loss in her brother; Heart disease in her brother; Hyperlipidemia in her brother and mother; Hypertension in her brother, mother, and sister; Seizures in her brother; Stroke in her mother and sister.She reports that she has never smoked. She has never used smokeless tobacco. She reports current alcohol use of about 2.0 - 3.0 standard drinks of alcohol per week. She reports that she does not use drugs.  Outpatient Medications Prior to Visit  Medication Sig Dispense Refill   aspirin EC 81 MG tablet Take 81 mg by mouth daily. Swallow whole.     b complex vitamins tablet Take 1 tablet by mouth daily.     Cholecalciferol (D3 5000) 125 MCG (5000 UT) capsule Take 5,000 Units by mouth daily.     Cinnamon 500 MG TABS Take by mouth.     citalopram (CELEXA) 20 MG tablet Take 20 mg by mouth.     losartan-hydrochlorothiazide (HYZAAR) 100-12.5  MG tablet Take 1 tablet by mouth daily.     MAGNESIUM  GLYCINATE PO Take 500 mg by mouth at bedtime.     Melatonin 12 MG TABS Take by mouth.     metFORMIN (GLUCOPHAGE-XR) 500 MG 24 hr tablet TAKE 1 TABLET BY MOUTH TWICE A DAY     Multiple Vitamins-Minerals (MULTI-VITE PO) Take by mouth.     Omega 3-6-9 Fatty Acids (OMEGA 3-6-9 COMPLEX PO) Take by mouth.     omeprazole (PRILOSEC) 40 MG capsule Take 40 mg by mouth 2 (two) times daily.      OVER THE COUNTER MEDICATION Take 111.1 mg by  mouth 2 (two) times daily. MILK THISTLE ORAL     pregabalin (LYRICA) 50 MG capsule Take 50 mg by mouth 2 (two) times daily.     rosuvastatin (CRESTOR) 20 MG tablet Take 20 mg by mouth daily.     Turmeric (QC TUMERIC COMPLEX PO) Take by mouth.     zaleplon (SONATA) 5 MG capsule Take 5 mg by mouth.     azithromycin  (ZITHROMAX  Z-PAK) 250 MG tablet 2 pills today then 1 pill a day for 4 days (Patient not taking: Reported on 10/02/2020) 6 each 0   escitalopram  (LEXAPRO ) 5 MG tablet TAKE 1 TABLET BY MOUTH TWICE DAILY (Patient not taking: Reported on 10/02/2020)     fluticasone  (FLONASE ) 50 MCG/ACT nasal spray Place 2 sprays into both nostrils daily. (Patient not taking: Reported on 10/02/2020) 16 g 6   glucose blood test strip USE 1 STRIP TWICE A DAY AS DIRECTED (Patient not taking: Reported on 10/02/2020)     ONETOUCH DELICA LANCETS 33G MISC USE 1 LANCET TO SKIN TWICE A DAY AS DIRECTED (Patient not taking: Reported on 10/02/2020)     vitamin B-12 (CYANOCOBALAMIN) 100 MCG tablet Take by mouth daily.  (Patient not taking: Reported on 10/02/2020)     No facility-administered medications prior to visit.    Review of Systems:  Patient denies headache, fevers, malaise, unintentional weight loss, skin rash, eye pain, sinus congestion and sinus pain, sore throat, dysphagia,  hemoptysis , cough, dyspnea, wheezing, chest pain, palpitations, orthopnea, edema, abdominal pain, nausea, melena, diarrhea, constipation, flank pain, dysuria, hematuria, urinary  Frequency, nocturia, numbness, tingling, seizures,  Focal weakness, Loss of consciousness,  Tremor, insomnia, depression, anxiety, and suicidal ideation.     Objective:  BP 102/68 (Cuff Size: Normal)   Pulse 71   Temp 98.7 F (37.1 C) (Oral)   Ht 5' 3.5 (1.613 m)   Wt 172 lb (78 kg)   SpO2 97%   BMI 29.99 kg/m   Physical Exam Vitals reviewed.  Constitutional:      General: She is not in acute distress.    Appearance: Normal appearance. She is normal weight.  She is not ill-appearing, toxic-appearing or diaphoretic.  HENT:     Head: Normocephalic.  Eyes:     General: No scleral icterus.       Right eye: No discharge.        Left eye: No discharge.     Conjunctiva/sclera: Conjunctivae normal.  Cardiovascular:     Rate and Rhythm: Normal rate and regular rhythm.     Heart sounds: Normal heart sounds.  Pulmonary:     Effort: Pulmonary effort is normal. No respiratory distress.     Breath sounds: Normal breath sounds.  Musculoskeletal:        General: Normal range of motion.  Skin:    General: Skin is warm and dry.  Neurological:     General: No focal deficit present.     Mental Status: She is alert and oriented to person, place, and time. Mental status is at baseline.  Psychiatric:        Mood and Affect: Mood normal.        Behavior: Behavior normal.        Thought Content: Thought content normal.        Judgment: Judgment normal.     Assessment & Plan:  Other fatigue -     CBC with Differential/Platelet -     TSH -     CBC With Diff/Platelet; Future -     B12 and Folate Panel  Hepatic steatosis Assessment & Plan: Suggested by prior Ultrasound , secondary to  central obesity, diabetes, hypertriglyceridemia.   Liver ultrasound reviewed (2019).  Liver enzymes remain elevated.  Discussed use of GLP 1 agonist;  hse has deferred for now.     Obesity, diabetes, and hypertension syndrome (HCC) -     Lipid panel -     LDL cholesterol, direct -     Comprehensive metabolic panel with GFR -     Hemoglobin A1c -     Microalbumin / creatinine urine ratio  Restless legs -     IBC + Ferritin  Breast cancer screening by mammogram -     3D Screening Mammogram, Left and Right; Future  Type 2 diabetes mellitus in patient with obesity (HCC) Assessment & Plan: Currently well-controlled on current medications  but has daily diarrhea with metformin.  Will consider GLP 1 agonist if insurance will cover.   Foot exam is ormal/  Patient has no  microalbuminuria. Patient is tolerating statin therapy for CAD risk reduction and on ACE/ARB for renal protection and hypertension    Lab Results  Component Value Date   HGBA1C 6.4 12/19/2023   Lab Results  Component Value Date   MICROALBUR 1.0 12/19/2023        B12 deficiency Assessment & Plan: Likely due to concurrent use of metformin and PPI.  IM injections needed   Orders: -     Intrinsic Factor Antibodies; Future  Barrett's esophagus without dysplasia Assessment & Plan: By prior EGD.  Continue PPI and survillance EGD       I personally spent a total of 45 minutes in the care of the patient today including preparing to see the patient, getting/reviewing separately obtained history, performing a medically appropriate exam/evaluation, counseling and educating, placing orders, documenting clinical information in the EHR, independently interpreting results, and communicating results.   Follow-up: Return in about 3 months (around 03/20/2024) for physical.   Verneita LITTIE Kettering, MD

## 2023-12-21 ENCOUNTER — Ambulatory Visit: Payer: Self-pay | Admitting: Internal Medicine

## 2023-12-21 DIAGNOSIS — E538 Deficiency of other specified B group vitamins: Secondary | ICD-10-CM | POA: Insufficient documentation

## 2023-12-21 NOTE — Assessment & Plan Note (Signed)
 By prior EGD.  Continue PPI and survillance EGD

## 2023-12-21 NOTE — Assessment & Plan Note (Signed)
 Likely due to concurrent use of metformin and PPI.  IM injections needed

## 2023-12-21 NOTE — Assessment & Plan Note (Signed)
 Currently well-controlled on current medications  but has daily diarrhea with metformin.  Will consider GLP 1 agonist if insurance will cover.   Foot exam is ormal/  Patient has no microalbuminuria. Patient is tolerating statin therapy for CAD risk reduction and on ACE/ARB for renal protection and hypertension    Lab Results  Component Value Date   HGBA1C 6.4 12/19/2023   Lab Results  Component Value Date   MICROALBUR 1.0 12/19/2023

## 2023-12-21 NOTE — Assessment & Plan Note (Addendum)
 Suggested by prior Ultrasound , secondary to  central obesity, diabetes, hypertriglyceridemia.   Liver ultrasound reviewed (2019).  Liver enzymes remain elevated.  Discussed use of GLP 1 agonist;  hse has deferred for now.

## 2023-12-26 ENCOUNTER — Ambulatory Visit (INDEPENDENT_AMBULATORY_CARE_PROVIDER_SITE_OTHER)

## 2023-12-26 DIAGNOSIS — E538 Deficiency of other specified B group vitamins: Secondary | ICD-10-CM | POA: Diagnosis not present

## 2023-12-26 MED ORDER — CYANOCOBALAMIN 1000 MCG/ML IJ SOLN
1000.0000 ug | Freq: Once | INTRAMUSCULAR | Status: AC
  Administered 2023-12-26: 1000 ug via INTRAMUSCULAR

## 2023-12-26 NOTE — Progress Notes (Signed)
Pt received B12 injection in right deltoid muscle. Pt tolerated it well with no complaints or concerns.  

## 2024-01-02 ENCOUNTER — Ambulatory Visit (INDEPENDENT_AMBULATORY_CARE_PROVIDER_SITE_OTHER)

## 2024-01-02 DIAGNOSIS — E538 Deficiency of other specified B group vitamins: Secondary | ICD-10-CM

## 2024-01-02 MED ORDER — CYANOCOBALAMIN 1000 MCG/ML IJ SOLN
1000.0000 ug | Freq: Once | INTRAMUSCULAR | Status: AC
  Administered 2024-01-02: 1000 ug via INTRAMUSCULAR

## 2024-01-02 NOTE — Progress Notes (Signed)
 Pt received B12 injection in Left  deltoid muscle. Pt tolerated it well with no complaints or concerns.

## 2024-01-09 ENCOUNTER — Other Ambulatory Visit: Payer: Self-pay | Admitting: Internal Medicine

## 2024-01-09 ENCOUNTER — Telehealth: Payer: Self-pay

## 2024-01-09 ENCOUNTER — Ambulatory Visit (INDEPENDENT_AMBULATORY_CARE_PROVIDER_SITE_OTHER)

## 2024-01-09 DIAGNOSIS — E538 Deficiency of other specified B group vitamins: Secondary | ICD-10-CM

## 2024-01-09 MED ORDER — CYANOCOBALAMIN 1000 MCG/ML IJ SOLN
1000.0000 ug | Freq: Once | INTRAMUSCULAR | Status: AC
  Administered 2024-01-09: 1000 ug via INTRAMUSCULAR

## 2024-01-09 NOTE — Progress Notes (Signed)
 After obtaining consent, and per orders of Dr.Tullo, MD injection of B-12 given IM in R Deltoid by Doyce Croak, CMA. Patient tolerated injection well.

## 2024-01-09 NOTE — Telephone Encounter (Signed)
 Patient came in for Nurse Visit for B-12 Injection. While the patient was here in the office she states she was recently prescribed Ambien Extended Release to help her sleep, but she is still having issues with sleep. Patient states they discussed Immediate Release and she wanted to inform Dr Marylynn and get her next recommended steps. Patient was advised Dr Marylynn is out of the office and will return Monday, before she may hear a response. Verbalized understanding.

## 2024-01-13 ENCOUNTER — Telehealth: Payer: Self-pay

## 2024-01-13 MED ORDER — ALPRAZOLAM 0.25 MG PO TABS: 0.2500 mg | ORAL_TABLET | Freq: Every evening | ORAL | 0 refills | Status: DC | PRN

## 2024-01-13 NOTE — Telephone Encounter (Signed)
 Copied from CRM #[id number]. Topic: Clinical - Medication Question >> Jan 13, 2024 10:52 AM Autumn Bird GRADE wrote: Reason for CRM: Patient is calling because she was prescribed zolpidem (AMBIEN CR) 6.25 MG CR tablet [[id number]] at her last visit on 12/19/2023 due to trouble sleeping, She wanted to advise that it still takes her a while to fall asleep after taking the medication and she remembers Dr.Tullo mentioning a fast acting medication and wanted to know if she can try that one.

## 2024-01-13 NOTE — Telephone Encounter (Signed)
 Noted

## 2024-01-13 NOTE — Addendum Note (Signed)
 Addended by: MARYLYNN VERNEITA CROME on: 01/13/2024 05:47 PM   Modules accepted: Orders

## 2024-01-14 ENCOUNTER — Ambulatory Visit: Admitting: Internal Medicine

## 2024-01-14 NOTE — Telephone Encounter (Signed)
 LMTCB. Please relay message to pt when she returns call to the office.

## 2024-01-15 NOTE — Telephone Encounter (Signed)
 FYI

## 2024-01-15 NOTE — Telephone Encounter (Unsigned)
 Copied from CRM #[id number]. Topic: Clinical - Prescription Issue >> Jan 15, 2024 10:00 AM Autumn Bird wrote: Reason for CRM: Patient returned call and stated that she does not feel comfortable taking the new medication alprazolam  that was prescribed. She stated she will keep her appointment for 11/24 to go over options with Dr. Marylynn. No call back is needed.

## 2024-01-16 ENCOUNTER — Ambulatory Visit (INDEPENDENT_AMBULATORY_CARE_PROVIDER_SITE_OTHER)

## 2024-01-16 DIAGNOSIS — E538 Deficiency of other specified B group vitamins: Secondary | ICD-10-CM | POA: Diagnosis not present

## 2024-01-16 MED ORDER — CYANOCOBALAMIN 1000 MCG/ML IJ SOLN
1000.0000 ug | Freq: Once | INTRAMUSCULAR | Status: AC
  Administered 2024-01-16: 1000 ug via INTRAMUSCULAR

## 2024-01-16 NOTE — Progress Notes (Signed)
 After obtaining consent, and per orders of Dr.Tullo, MD injection of B-12 injection given IM in L Deltoid by Doyce Croak, CMA. Patient tolerated injection well.

## 2024-01-16 NOTE — Addendum Note (Signed)
 Addended by: TANDA HARVEY D on: 01/16/2024 02:48 PM   Modules accepted: Orders

## 2024-01-18 ENCOUNTER — Ambulatory Visit: Payer: Self-pay | Admitting: Internal Medicine

## 2024-01-18 LAB — INTRINSIC FACTOR ANTIBODIES: Intrinsic Factor Abs, Serum: 14 [AU]/ml — ABNORMAL HIGH (ref 0.0–1.1)

## 2024-01-19 ENCOUNTER — Telehealth: Payer: Self-pay

## 2024-01-19 ENCOUNTER — Other Ambulatory Visit (HOSPITAL_COMMUNITY): Payer: Self-pay

## 2024-01-19 ENCOUNTER — Encounter: Payer: Self-pay | Admitting: Internal Medicine

## 2024-01-19 ENCOUNTER — Ambulatory Visit (INDEPENDENT_AMBULATORY_CARE_PROVIDER_SITE_OTHER): Admitting: Internal Medicine

## 2024-01-19 VITALS — BP 128/70 | HR 72 | Ht 63.5 in | Wt 171.4 lb

## 2024-01-19 DIAGNOSIS — F5101 Primary insomnia: Secondary | ICD-10-CM

## 2024-01-19 DIAGNOSIS — E119 Type 2 diabetes mellitus without complications: Secondary | ICD-10-CM

## 2024-01-19 DIAGNOSIS — D510 Vitamin B12 deficiency anemia due to intrinsic factor deficiency: Secondary | ICD-10-CM

## 2024-01-19 DIAGNOSIS — Z780 Asymptomatic menopausal state: Secondary | ICD-10-CM | POA: Diagnosis not present

## 2024-01-19 DIAGNOSIS — F5104 Psychophysiologic insomnia: Secondary | ICD-10-CM

## 2024-01-19 DIAGNOSIS — Z6829 Body mass index (BMI) 29.0-29.9, adult: Secondary | ICD-10-CM | POA: Diagnosis not present

## 2024-01-19 DIAGNOSIS — E538 Deficiency of other specified B group vitamins: Secondary | ICD-10-CM | POA: Diagnosis not present

## 2024-01-19 DIAGNOSIS — K2270 Barrett's esophagus without dysplasia: Secondary | ICD-10-CM | POA: Diagnosis not present

## 2024-01-19 DIAGNOSIS — K760 Fatty (change of) liver, not elsewhere classified: Secondary | ICD-10-CM

## 2024-01-19 DIAGNOSIS — E669 Obesity, unspecified: Secondary | ICD-10-CM | POA: Diagnosis not present

## 2024-01-19 DIAGNOSIS — Z1231 Encounter for screening mammogram for malignant neoplasm of breast: Secondary | ICD-10-CM

## 2024-01-19 DIAGNOSIS — Z1211 Encounter for screening for malignant neoplasm of colon: Secondary | ICD-10-CM

## 2024-01-19 MED ORDER — SYRINGE 25G X 1" 3 ML MISC
0 refills | Status: AC
Start: 2024-01-19 — End: ?

## 2024-01-19 MED ORDER — CYANOCOBALAMIN 1000 MCG/ML IJ SOLN: 1000.0000 ug | INTRAMUSCULAR | 3 refills | Status: AC

## 2024-01-19 MED ORDER — ZOLPIDEM TARTRATE ER 6.25 MG PO TBCR: 6.2500 mg | EXTENDED_RELEASE_TABLET | Freq: Every evening | ORAL | 5 refills | Status: AC | PRN

## 2024-01-19 MED ORDER — CYANOCOBALAMIN 1000 MCG/ML IJ SOLN: 1000.0000 ug | INTRAMUSCULAR | 3 refills | Status: DC

## 2024-01-19 NOTE — Telephone Encounter (Signed)
 Pt may just have to come in the office to receive her b12 injections in order for insurance to cover them.

## 2024-01-19 NOTE — Telephone Encounter (Signed)
 Pharmacy Patient Advocate Encounter   Received notification from Onbase that prior authorization for Cyanocobalamin  1000MCG/ML solution is required/requested.   Insurance verification completed.   The patient is insured through [facility].   Per test claim: Per test claim, medication is not covered due to plan/benefit exclusion, PA not submitted at this time

## 2024-01-19 NOTE — Patient Instructions (Addendum)
 I recommend continued screening for colon CA with Cologyard.  This screening test for colon cancer is very accurate in patients who are at average risk for colon cancer, if  repeated  every 3 years. .  We can continue to use if until you are 84 for  colon CA screening, unless you develop a change in bowel function or a family history of colon Cancer.  These developments would  change you from being at average risk to increased risk for colon cancer and would require colonoscopy for ongoing screening .   I have refilled the ambien  cr.  Take it 2 hours before bedtime  Your annual mammogram AND  DEXA  SCAN have been ordered.  Please call [facility] to call to make your appointments  .  The phone number for [facility] is  [phone]    You may cancel the January appt and schedule your CPE in April.   The labs have been ordered if you would like to do them ahead of your visit

## 2024-01-19 NOTE — Progress Notes (Unsigned)
 Subjective:  Patient ID: Autumn Bird, female    DOB: 1955/06/10  Age: 68 y.o. MRN: [id number]  CC: There were no encounter diagnoses.   HPI Autumn Bird presents for  Chief Complaint  Patient presents with   Medical Management of Chronic Issues    Discuss sleep medications    1) Chronic insomnia:  patient has not been sleeping well with Ambien  CR trial prescribed at last visit .  She slept well initially  after a 2 hour delay.  Did not sleep with alprazolam  .   2)  health maintenance:  has deferred colon CA screening and is several years overdue for breast ca screening   3)  type 2 DM:;  metformin continued to  1000 mg twice daily  despite daily  loose stools .    Outpatient Medications Prior to Visit  Medication Sig Dispense Refill   ALPRAZolam  (XANAX ) 0.25 MG tablet Take 1 tablet (0.25 mg total) by mouth at bedtime as needed for anxiety. 20 tablet 0   aspirin EC 81 MG tablet Take 81 mg by mouth daily. Swallow whole.     b complex vitamins tablet Take 1 tablet by mouth daily.     Cholecalciferol (D3 5000) 125 MCG (5000 UT) capsule Take 5,000 Units by mouth daily.     Cinnamon 500 MG TABS Take by mouth.     citalopram (CELEXA) 20 MG tablet Take 20 mg by mouth.     losartan-hydrochlorothiazide (HYZAAR) 100-12.5 MG tablet Take 1 tablet by mouth daily.     MAGNESIUM  GLYCINATE PO Take 500 mg by mouth at bedtime.     Melatonin 12 MG TABS Take by mouth.     metFORMIN (GLUCOPHAGE) 1000 MG tablet Take 1,000 mg by mouth 2 (two) times daily with a meal.     Multiple Vitamins-Minerals (MULTI-VITE PO) Take by mouth.     Omega 3-6-9 Fatty Acids (OMEGA 3-6-9 COMPLEX PO) Take by mouth.     omeprazole (PRILOSEC) 40 MG capsule Take 40 mg by mouth 2 (two) times daily.      OVER THE COUNTER MEDICATION Take 111.1 mg by mouth 2 (two) times daily. MILK THISTLE ORAL     pregabalin (LYRICA) 50 MG capsule Take 50 mg by mouth 2 (two) times daily.     rosuvastatin (CRESTOR) 20 MG tablet Take 20 mg by  mouth daily.     Turmeric (QC TUMERIC COMPLEX PO) Take by mouth.     metFORMIN (GLUCOPHAGE-XR) 500 MG 24 hr tablet TAKE 1 TABLET BY MOUTH TWICE A DAY (Patient not taking: Reported on 01/19/2024)     No facility-administered medications prior to visit.    Review of Systems;  Patient denies headache, fevers, malaise, unintentional weight loss, skin rash, eye pain, sinus congestion and sinus pain, sore throat, dysphagia,  hemoptysis , cough, dyspnea, wheezing, chest pain, palpitations, orthopnea, edema, abdominal pain, nausea, melena, diarrhea, constipation, flank pain, dysuria, hematuria, urinary  Frequency, nocturia, numbness, tingling, seizures,  Focal weakness, Loss of consciousness,  Tremor, insomnia, depression, anxiety, and suicidal ideation.      Objective:  BP 128/70   Pulse 72   Ht 5' 3.5 (1.613 m)   Wt 171 lb 6.4 oz (77.7 kg)   SpO2 97%   BMI 29.89 kg/m   BP Readings from Last 3 Encounters:  01/19/24 128/70  12/19/23 102/68  07/25/22 131/79    Wt Readings from Last 3 Encounters:  01/19/24 171 lb 6.4 oz (77.7 kg)  12/19/23 172 lb (  78 kg)  12/28/14 176 lb (79.8 kg)    Physical Exam  Lab Results  Component Value Date   HGBA1C 6.4 12/19/2023   HGBA1C 9.6 (H) 02/14/2012    Lab Results  Component Value Date   CREATININE 0.81 12/19/2023   CREATININE 0.58 (L) 02/14/2012    Lab Results  Component Value Date   WBC 5.6 12/19/2023   HGB 12.1 12/19/2023   HCT 36.1 12/19/2023   PLT 133.0 (L) 12/19/2023   GLUCOSE 131 (H) 12/19/2023   CHOL 113 12/19/2023   TRIG 192.0 (H) 12/19/2023   HDL 44.10 12/19/2023   LDLDIRECT 47.0 12/19/2023   LDLCALC 30 12/19/2023   ALT 41 (H) 12/19/2023   AST 57 (H) 12/19/2023   NA 138 12/19/2023   K 4.0 12/19/2023   CL 100 12/19/2023   CREATININE 0.81 12/19/2023   BUN 17 12/19/2023   CO2 27 12/19/2023   TSH 1.82 12/19/2023   HGBA1C 6.4 12/19/2023   MICROALBUR 1.0 12/19/2023    MR BRAIN WO CONTRAST Result Date:  04/23/2022 CLINICAL DATA:  NKI: no surg; c/o feeling off balance and blurry vision x1.5 weeks EXAM: MRI HEAD WITHOUT CONTRAST TECHNIQUE: Multiplanar, multiecho pulse sequences of the brain and surrounding structures were obtained without intravenous contrast. COMPARISON:  CT head from the same day. FINDINGS: Brain: No convincing acute infarct. Remote infarct in the left basal ganglia. Punctate focus of DWI hyperintensity in the region of this infarct is favored artifactual given no convincing correlate on ADC and given some susceptibility artifact in this region. No acute hemorrhage, hydrocephalus, extra-axial collection or mass lesion. Vascular: Major arterial flow voids are maintained. Skull and upper cervical spine: Normal marrow signal. Sinuses/Orbits: Clear sinuses.  No acute orbital findings. Other: No sizable mastoid effusions. IMPRESSION: 1. No evidence of acute intracranial abnormality. 2. Remote left basal ganglia lacunar infarct. Electronically Signed   By: Gilmore GORMAN Molt M.D.   On: 04/23/2022 15:50   CT HEAD WO CONTRAST ( ) Result Date: 04/23/2022 CLINICAL DATA:  One-week history of ataxia. EXAM: CT HEAD WITHOUT CONTRAST TECHNIQUE: Contiguous axial images were obtained from the base of the skull through the vertex without intravenous contrast. RADIATION DOSE REDUCTION: This exam was performed according to the departmental dose-optimization program which includes automated exposure control, adjustment of the mA and/or kV according to patient size and/or use of iterative reconstruction technique. COMPARISON:  None Available. FINDINGS: Brain: No evidence of acute infarction, hemorrhage, hydrocephalus, extra-axial collection or mass lesion/mass effect. Chronic appearing perforator infarct at the left basal ganglia. Streak artifact versus chronic lacune at the ventral right pons. Vascular: No hyperdense vessel or unexpected calcification. Skull: Normal. Negative for fracture or focal lesion.  Sinuses/Orbits: No acute finding. IMPRESSION: No acute finding. Chronic infarct at the left basal ganglia. Electronically Signed   By: Dorn Roulette M.D.   On: 04/23/2022 12:39    Assessment & Plan:  .There are no diagnoses linked to this encounter.   I spent 34 minutes on the day of this face to face encounter reviewing patient's  most recent visit with cardiology,  nephrology,  and neurology,  prior relevant surgical and non surgical procedures, recent  labs and imaging studies, counseling on weight management,  reviewing the assessment and plan with patient, and post visit ordering and reviewing of  diagnostics and therapeutics with patient  .   Follow-up: No follow-ups on file.   Verneita LITTIE Kettering, MD

## 2024-01-19 NOTE — Assessment & Plan Note (Signed)
 Currently well-controlled on current medications  but has daily diarrhea with metformin.  Will consider GLP 1 agonist if insurance will cover.   Foot exam is ormal/  Patient has no microalbuminuria. Patient is tolerating statin therapy for CAD risk reduction and on ACE/ARB for renal protection and hypertension    Lab Results  Component Value Date   HGBA1C 6.4 12/19/2023   Lab Results  Component Value Date   MICROALBUR 1.0 12/19/2023

## 2024-01-20 ENCOUNTER — Other Ambulatory Visit (HOSPITAL_COMMUNITY): Payer: Self-pay

## 2024-01-20 DIAGNOSIS — F5104 Psychophysiologic insomnia: Secondary | ICD-10-CM | POA: Insufficient documentation

## 2024-01-20 NOTE — Assessment & Plan Note (Signed)
 Suggested by prior Ultrasound , secondary to  central obesity, diabetes, hypertriglyceridemia.   Liver ultrasound reviewed (2019).  Liver enzymes remain elevated.  Discussed use of GLP 1 agonist;  she  has deferred for now.    Lab Results  Component Value Date   ALT 41 (H) 12/19/2023   AST 57 (H) 12/19/2023   ALKPHOS 32 (L) 12/19/2023   BILITOT 0.4 12/19/2023

## 2024-01-20 NOTE — Assessment & Plan Note (Signed)
 Advised to continue ambien  CR but take dose earlier in the evening.

## 2024-01-20 NOTE — Telephone Encounter (Signed)
 Dx code has been changed for the b12 injections. Can the PA be resubmitted with updated info.

## 2024-01-20 NOTE — Assessment & Plan Note (Signed)
 By prior EGD.  Continue PPI and survillance EGD

## 2024-01-20 NOTE — Assessment & Plan Note (Signed)
 IF Ab posiitve.  Symptoms of fatigue have improved with weekly parenteral supplementation.  Advised to continue monthly parenteral supplementation

## 2024-02-03 ENCOUNTER — Ambulatory Visit: Payer: Self-pay | Admitting: Internal Medicine

## 2024-02-03 DIAGNOSIS — R195 Other fecal abnormalities: Secondary | ICD-10-CM

## 2024-02-03 LAB — COLOGUARD: COLOGUARD: POSITIVE — AB

## 2024-02-22 DIAGNOSIS — R195 Other fecal abnormalities: Secondary | ICD-10-CM | POA: Insufficient documentation

## 2024-02-27 LAB — OPHTHALMOLOGY REPORT-SCANNED

## 2024-03-22 ENCOUNTER — Ambulatory Visit: Admitting: Internal Medicine

## 2024-08-10 ENCOUNTER — Ambulatory Visit: Admitting: Dermatology
